# Patient Record
Sex: Male | Born: 1935 | Race: White | Hispanic: No | Marital: Married | Smoking: Never smoker
Health system: Southern US, Community
[De-identification: ages and names within clinical notes are randomized; demographics above are authoritative.]

## PROBLEM LIST (undated history)

## (undated) DIAGNOSIS — E785 Hyperlipidemia, unspecified: Secondary | ICD-10-CM

## (undated) DIAGNOSIS — I10 Essential (primary) hypertension: Secondary | ICD-10-CM

## (undated) DIAGNOSIS — N401 Enlarged prostate with lower urinary tract symptoms: Secondary | ICD-10-CM

## (undated) HISTORY — DX: Benign prostatic hyperplasia with lower urinary tract symptoms: N40.1

## (undated) HISTORY — DX: Hyperlipidemia, unspecified: E78.5

## (undated) HISTORY — DX: Essential (primary) hypertension: I10

---
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
## 2004-03-15 ENCOUNTER — Ambulatory Visit (HOSPITAL_COMMUNITY): Admission: RE | Admit: 2004-03-15 | Discharge: 2004-03-15 | Payer: Self-pay | Admitting: General Surgery

## 2009-02-25 ENCOUNTER — Ambulatory Visit: Payer: Self-pay | Admitting: Family Medicine

## 2009-02-25 DIAGNOSIS — I10 Essential (primary) hypertension: Secondary | ICD-10-CM | POA: Insufficient documentation

## 2009-02-25 DIAGNOSIS — N138 Other obstructive and reflux uropathy: Secondary | ICD-10-CM

## 2009-02-25 DIAGNOSIS — N529 Male erectile dysfunction, unspecified: Secondary | ICD-10-CM | POA: Insufficient documentation

## 2009-02-25 DIAGNOSIS — E785 Hyperlipidemia, unspecified: Secondary | ICD-10-CM | POA: Insufficient documentation

## 2009-02-25 DIAGNOSIS — N401 Enlarged prostate with lower urinary tract symptoms: Secondary | ICD-10-CM

## 2009-02-25 DIAGNOSIS — N400 Enlarged prostate without lower urinary tract symptoms: Secondary | ICD-10-CM | POA: Insufficient documentation

## 2009-02-25 HISTORY — DX: Other obstructive and reflux uropathy: N13.8

## 2009-02-25 HISTORY — DX: Hyperlipidemia, unspecified: E78.5

## 2009-02-25 HISTORY — DX: Benign prostatic hyperplasia with lower urinary tract symptoms: N40.1

## 2009-03-25 ENCOUNTER — Ambulatory Visit: Payer: Self-pay | Admitting: Family Medicine

## 2009-03-26 LAB — CONVERTED CEMR LAB
ALT: 29 units/L (ref 0–53)
BUN: 10 mg/dL (ref 6–23)
Bilirubin, Direct: 0.1 mg/dL (ref 0.0–0.3)
CO2: 29 meq/L (ref 19–32)
Chloride: 100 meq/L (ref 96–112)
GFR calc non Af Amer: 68.38 mL/min (ref >60)
Glucose, Bld: 95 mg/dL (ref 70–99)
HDL: 86.4 mg/dL (ref >39.00)
Potassium: 4.6 meq/L (ref 3.5–5.1)
Total Bilirubin: 1.1 mg/dL (ref 0.3–1.2)
Total CHOL/HDL Ratio: 2

## 2009-05-03 ENCOUNTER — Telehealth: Payer: Self-pay | Admitting: Family Medicine

## 2010-04-07 NOTE — Progress Notes (Signed)
Summary: REQ FOR MED to [facility]  Phone Note Call from Patient   Caller: Patient [phone] Reason for Call: Refill Medication Summary of Call: Pt called to adv that his insurance has changed and he will need a paper prescription for refill on meds:    Simvastatin 20 Mg   and  Lisinopril 10 Mg    and    Avodart 0.5 Mg...Marland KitchenMarland KitchenMarland Kitchen Pt needs a 90-day  (paper/written) RX for same because he will be sending RX to [facility].   Pt can be reached at [phone] with any questions or concerns...Marland KitchenMarland Kitchen Please call when same is ready for p/u.   Initial call taken by: Debbra Riding,  May 03, 2009 11:23 AM  Follow-up for Phone Call        Wife informed we can send by fax, she asked we do that instead of written.  Meds faxed Follow-up by: Sid Falcon LPN,  May 03, 2009 1:21 PM    Prescriptions: AVODART 0.5 MG CAPS (DUTASTERIDE) one by mouth once daily  #90 x 3   Entered by:   Sid Falcon LPN   Authorized by:   Evelena Peat MD   Signed by:   Sid Falcon LPN on 13/10/6576   Method used:   Faxed to ...       [facility]             , [place]         Ph:        Fax: [phone]   RxID:   [id number] LISINOPRIL 10 MG TABS (LISINOPRIL) once daily  #90 x 3   Entered by:   Sid Falcon LPN   Authorized by:   Evelena Peat MD   Signed by:   Sid Falcon LPN on 40/34/7425   Method used:   Faxed to ...       [facility]             , [place]         Ph:        Fax: [phone]   RxID:   [id number] SIMVASTATIN 20 MG TABS (SIMVASTATIN) once daily  #90 x 3   Entered by:   Sid Falcon LPN   Authorized by:   Evelena Peat MD   Signed by:   Sid Falcon LPN on 60/12/9321   Method used:   Faxed to ...       [facility]             , [place]         Ph:        Fax: [phone]   RxID:    [id number]

## 2010-04-07 NOTE — Assessment & Plan Note (Signed)
Summary: PT WILL COME IN FASTING //RS/PT RSC/CJR/PT R/S..SLM   Vital Signs:  Patient profile:   77 year old male Height:      64.25 inches Weight:      132 pounds Temp:     98.7 degrees F oral Pulse rate:   80 / minute Pulse rhythm:   regular Resp:     12 per minute BP sitting:   140 / 70  (left arm) Cuff size:   regular  Vitals Entered By: Sid Falcon LPN (March 25, 2009 8:54 AM) CC: CPX, pt fasting   History of Present Illness: Followup multiple medical problems.  History BPH. Significant nocturia. Recently started Avodart and tolerating well. Thus far no significant improvement in symptoms but only taking for less than one month. No burning with urination.  Hyperlipidemia treated with simvastatin. Needs lab work. Compliant with therapy. No history of CAD. No side effects from medication.  Erectile dysfunction. Has not tried Cialis yet. No history nitroglycerin use.  Hypertension treated with lisinopril. No cough or other side effects. Blood pressures have been well controlled. Denies any headaches, dizziness, chest pain, palpitations.  Patient refuses Pneumovax and flu vaccine. Last tetanus less than 5 years ago.   Allergies (verified): No Known Drug Allergies  Past History:  Past Medical History: Last updated: 02/25/2009 Hyperlipidemia Hypertension BPH  Erectile dysfunction  Past Surgical History: Last updated: 02/25/2009 2 hernia repairs Tonsillectomy  1935  Social History: Last updated: 02/25/2009 Occupation: Married Never Smoked Alcohol use-yes Regular exercise-yes  Review of Systems  The patient denies anorexia, fever, weight loss, weight gain, chest pain, syncope, dyspnea on exertion, peripheral edema, prolonged cough, headaches, hemoptysis, abdominal pain, melena, hematochezia, severe indigestion/heartburn, hematuria, incontinence, muscle weakness, and depression.    Physical Exam  General:  Well-developed,well-nourished,in no acute  distress; alert,appropriate and cooperative throughout examination Head:  Normocephalic and atraumatic without obvious abnormalities. No apparent alopecia or balding. Eyes:  No corneal or conjunctival inflammation noted. EOMI. Perrla. Funduscopic exam benign, without hemorrhages, exudates or papilledema. Vision grossly normal. Ears:  External ear exam shows no significant lesions or deformities.  Otoscopic examination reveals clear canals, tympanic membranes are intact bilaterally without bulging, retraction, inflammation or discharge. Hearing is grossly normal bilaterally. Mouth:  Oral mucosa and oropharynx without lesions or exudates.  Teeth in good repair. Neck:  No deformities, masses, or tenderness noted. Lungs:  Normal respiratory effort, chest expands symmetrically. Lungs are clear to auscultation, no crackles or wheezes. Heart:  Normal rate and regular rhythm. S1 and S2 normal without gallop, murmur, click, rub or other extra sounds. Abdomen:  Bowel sounds positive,abdomen soft and non-tender without masses, organomegaly or hernias noted. Extremities:  No clubbing, cyanosis, edema, or deformity noted with normal full range of motion of all joints.   Neurologic:  alert & oriented X3, cranial nerves II-XII intact, strength normal in all extremities, and gait normal.   Skin:  multiple seborrheic keratoses otherwise no concerning lesions Cervical Nodes:  No lymphadenopathy noted Psych:  Oriented X3, normally interactive, good eye contact, not anxious appearing, and not depressed appearing.     Impression & Recommendations:  Problem # 1:  HYPERTROPHY PROSTATE W/UR OBST & OTH LUTS (ICD-600.01) cont Avodart.  He is aware this will take months to see sig effect.  Problem # 2:  HYPERTENSION (ICD-401.9)  His updated medication list for this problem includes:    Lisinopril 10 Mg Tabs (Lisinopril) ..... Once daily  Orders: TLB-BMP (Basic Metabolic Panel-BMET) (80048-METABOL) Venipuncture  (96295)  Problem #  3:  HYPERLIPIDEMIA (ICD-272.4)  His updated medication list for this problem includes:    Simvastatin 20 Mg Tabs (Simvastatin) ..... Once daily  Orders: TLB-Lipid Panel (80061-LIPID) TLB-Hepatic/Liver Function Pnl (80076-HEPATIC) Venipuncture (21308)  Problem # 4:  IMPOTENCE OF ORGANIC ORIGIN (ICD-607.84) Has not yet tried. His updated medication list for this problem includes:    Cialis 20 Mg Tabs (Tadalafil) ..... One by mouth every other day as needed  Complete Medication List: 1)  Simvastatin 20 Mg Tabs (Simvastatin) .... Once daily 2)  Lisinopril 10 Mg Tabs (Lisinopril) .... Once daily 3)  Aspirin 81 Mg Tabs (Aspirin) .... Once daily 4)  Centrum Ultra Mens Tabs (Multiple vitamins-minerals) .... Once daily 5)  Cialis 20 Mg Tabs (Tadalafil) .... One by mouth every other day as needed 6)  Avodart 0.5 Mg Caps (Dutasteride) .... One by mouth once daily 7)  Calcium-vitamin D 600-200 Mg-unit Tabs (Calcium-vitamin d) .... Once daily  Patient Instructions: 1)  Continue calcium and vitamin D supplementation. 2)  Walk frequently.  Consider podometer and 10,000 steps/day

## 2010-04-07 NOTE — Progress Notes (Signed)
Summary: REQ FOR SCRIPTS (FOR MAIL ORDER)  Phone Note Call from Patient   Caller: Patient [phone] Reason for Call: Talk to Nurse Summary of Call: Pt called back to speak with Harriett Sine, LPN.... Pt adv that his wife misinformed LBF about the prescriptions being able to be faxed into [facility].... This will be the pts first time having prescriptions filled through [facility], Therefore, he has to send in a paper / written prescription.... Please prepare same for meds:  Simvastatin 20 Mg   and  Lisinopril 10 Mg    and    Avodart 0.5 Mg..... Pt can be reached at [phone] when same is ready for p/u.  Initial call taken by: Debbra Riding,  May 03, 2009 2:38 PM  Follow-up for Phone Call        Pt informed 3 RX ready for pick-up Follow-up by: Sid Falcon LPN,  May 03, 2009 3:03 PM    Prescriptions: AVODART 0.5 MG CAPS (DUTASTERIDE) one by mouth once daily  #90 x 3   Entered by:   Sid Falcon LPN   Authorized by:   Evelena Peat MD   Signed by:   Sid Falcon LPN on 37/62/8315   Method used:   Print then Give to Patient   RxID:   [id number] LISINOPRIL 10 MG TABS (LISINOPRIL) once daily  #90 x 3   Entered by:   Sid Falcon LPN   Authorized by:   Evelena Peat MD   Signed by:   Sid Falcon LPN on 85/46/2703   Method used:   Print then Give to Patient   RxID:   [id number] SIMVASTATIN 20 MG TABS (SIMVASTATIN) once daily  #90 x 3   Entered by:   Sid Falcon LPN   Authorized by:   Evelena Peat MD   Signed by:   Sid Falcon LPN on 67/89/3810   Method used:   Print then Give to Patient   RxID:   [id number]

## 2010-04-13 ENCOUNTER — Encounter: Payer: Self-pay | Admitting: Family Medicine

## 2010-04-13 ENCOUNTER — Ambulatory Visit (INDEPENDENT_AMBULATORY_CARE_PROVIDER_SITE_OTHER): Payer: Medicare Other | Admitting: Family Medicine

## 2010-04-13 DIAGNOSIS — N401 Enlarged prostate with lower urinary tract symptoms: Secondary | ICD-10-CM

## 2010-04-13 DIAGNOSIS — I10 Essential (primary) hypertension: Secondary | ICD-10-CM

## 2010-04-13 MED ORDER — LISINOPRIL-HYDROCHLOROTHIAZIDE 10-12.5 MG PO TABS: 1.0000 | ORAL_TABLET | Freq: Every day | ORAL | Status: DC

## 2010-04-13 NOTE — Patient Instructions (Signed)
Hypertension (High Blood Pressure)   As your heart beats, it forces blood through your arteries. This force is your blood pressure. If the pressure is too high, it is called hypertension (HTN) or high blood pressure. HTN is dangerous because you may have it and not know it. High blood pressure may mean that your heart has to work harder to pump blood. Your arteries may be narrow or stiff. The extra work puts you at risk for heart disease, stroke, and other problems.   Blood pressure consists of two numbers, a higher number over a lower, 110/72, for example. It is stated as “110 over 72.” The ideal is below 120 for the top number (systolic) and under 80 for the bottom (diastolic).   You should pay close attention to your blood pressure if you have certain conditions such as:   Ø Heart failure.   Ø Prior heart attack.   Ø Diabetes Ø Chronic kidney disease.   Ø Prior stroke.   Ø Multiple risk factors for heart disease.       To see if you have HTN, your blood pressure should be measured while you are seated with your arm held at the level of the heart. It should be measured at least twice. A one-time elevated blood pressure reading (especially in the Emergency Department) does not mean that you need treatment. There may be conditions in which the blood pressure is different between your right and left arms. It is important to see your caregiver soon for a recheck.   Most people have essential hypertension which means that there is not a specific cause. This type of high blood pressure may be lowered by changing lifestyle factors such as:   Ø Stress.   Ø Smoking.   Ø Lack of exercise. Ø Excessive weight.   Ø Drug/tobacco/alcohol use.   Ø Eating less salt.    Most people do not have symptoms from high blood pressure until it has caused damage to the body. Effective treatment can often prevent, delay or reduce that damage.   TREATMENT:   Treatment for high blood pressure, when a cause has been identified, is directed at  the cause. There are a large number of medications to treat HTN. These fall into several categories, and your caregiver will help you select the medicines that are best for you. Medications may have side effects. You should review side effects with your caregiver.   If your blood pressure stays high after you have made lifestyle changes or started on medicines,   Ø Your medication(s) may need to be changed.   Ø Other problems may need to be addressed.   Ø Be certain you understand your prescriptions, and know how and when to take your medicine.   Ø Be sure to follow up with your caregiver within the time frame advised (usually within two weeks) to have your blood pressure rechecked and to review your medications.   Ø If you are taking more than one medicine to lower your blood pressure, make sure you know how and at what times they should be taken. Taking two medicines at the same time can result in blood pressure that is too low.   SEEK IMMEDIATE MEDICAL CARE IF YOU DEVELOP:   Ø A severe headache, blurred or changing vision, or confusion.   Ø Unusual weakness or numbness, or a faint feeling.   Ø Severe chest or abdominal pain, vomiting, or breathing problems.   It is your responsibility to follow   up with your caregiver in order to determine if your elevated blood pressure should be treated.   MAKE SURE YOU:   Ø Understand these instructions.   Ø Will watch your condition.   Ø Will get help right away if you are not doing well or get worse.   Document Released: 08/03/2005 Document Re-Released: 05/19/2008   ExitCare® Patient Information ©2011 [employer].

## 2010-04-13 NOTE — Progress Notes (Signed)
  Subjective:    Patient ID: Chase Hurst, male    DOB: 1926-10-18, 77 y.o.   MRN: [id number]  HPI  Patient seen for followup hypertension. Recent evaluation by ophthalmologist with blood pressure around 170/100. Takes lisinopril 10 mg daily. Denies any headaches. No dizziness. Compliant with medication. No side effects. Not monitoring blood pressure regularly at home. No history of CAD. No history of renal insufficiency.  History of benign prostate hypertrophy. Occasional nocturia. Takes Avodart 0.5 mg daily. Compliant with medication. Occasional slow stream. No burning with urination. Denies any fever or chills. Previously on Flomax. His symptoms are relatively mild and not interested in further medication this time.   History of hyperlipidemia. Plans to schedule repeat physical this May.   Review of Systems     Patient denies any headaches, fever, chills, appetite or weight change, chest pain, dyspnea, abdominal pain bony changes stool habits. Urinary symptoms as above. Objective:   Physical Exam  patient is alert and in no distress. Oropharynx clear.  Neck no masses  Chest clear to auscultation throughout  Heart regular rhythm and rate Abdomen soft and nontender.   Extremities no edema  Neuro exam alert and oriented times 3. Cranial nerves all intact       Assessment & Plan:   #1   Hypertension suboptimally controlled  #2   Benign prostate hypertrophy    change medication to lisinopril HCTZ 10/12.5 one daily and reassess blood pressure within couple months. Monitor closely at home. Consider addition of Flomax to his Avodart

## 2010-05-09 ENCOUNTER — Telehealth: Payer: Self-pay | Admitting: Family Medicine

## 2010-05-09 NOTE — Telephone Encounter (Signed)
Please advise 

## 2010-05-09 NOTE — Telephone Encounter (Signed)
Pt called and is req refill of Avodart 5 mg to [facility]. Pt also wants to know if this med would have any effect on Catarac surgery. Pt is sch to have cataracs removed on 05/30/10.

## 2010-05-10 MED ORDER — DUTASTERIDE 0.5 MG PO CAPS: 0.5000 mg | ORAL_CAPSULE | Freq: Every day | ORAL | Status: DC

## 2010-05-10 NOTE — Telephone Encounter (Signed)
[facility] as requested, pt informed of Dr Burchette's reply to question

## 2010-05-10 NOTE — Telephone Encounter (Signed)
OK to refill for one year.  No effect on cataract surgery to my knowledge.

## 2010-06-22 ENCOUNTER — Telehealth: Payer: Self-pay | Admitting: Family Medicine

## 2010-06-22 MED ORDER — SIMVASTATIN 20 MG PO TABS: 20.0000 mg | ORAL_TABLET | Freq: Every day | ORAL | Status: DC

## 2010-06-22 NOTE — Telephone Encounter (Signed)
Pt called and is req a 90 day supply of Lisinopril/HCTZ 10-12.5 mg and renewal script for Simvastatin 20 mg tab 90 day supply to [facility].

## 2010-07-19 ENCOUNTER — Ambulatory Visit (INDEPENDENT_AMBULATORY_CARE_PROVIDER_SITE_OTHER): Payer: Medicare Other | Admitting: Family Medicine

## 2010-07-19 ENCOUNTER — Encounter: Payer: Self-pay | Admitting: Family Medicine

## 2010-07-19 VITALS — BP 128/80 | HR 80 | Temp 98.2°F | Resp 12 | Ht 65.0 in | Wt 128.0 lb

## 2010-07-19 DIAGNOSIS — E785 Hyperlipidemia, unspecified: Secondary | ICD-10-CM

## 2010-07-19 DIAGNOSIS — N529 Male erectile dysfunction, unspecified: Secondary | ICD-10-CM

## 2010-07-19 DIAGNOSIS — Z299 Encounter for prophylactic measures, unspecified: Secondary | ICD-10-CM

## 2010-07-19 DIAGNOSIS — I10 Essential (primary) hypertension: Secondary | ICD-10-CM

## 2010-07-19 DIAGNOSIS — N401 Enlarged prostate with lower urinary tract symptoms: Secondary | ICD-10-CM

## 2010-07-19 LAB — HEPATIC FUNCTION PANEL
Albumin: 4 g/dL (ref 3.5–5.2)
Total Protein: 6.7 g/dL (ref 6.0–8.3)

## 2010-07-19 LAB — BASIC METABOLIC PANEL
Chloride: 99 mEq/L (ref 96–112)
Creatinine, Ser: 0.9 mg/dL (ref 0.4–1.5)
Potassium: 3.8 mEq/L (ref 3.5–5.1)
Sodium: 139 mEq/L (ref 135–145)

## 2010-07-19 LAB — LIPID PANEL
Cholesterol: 209 mg/dL — ABNORMAL HIGH (ref 0–200)
HDL: 87.5 mg/dL (ref >39.00)
Triglycerides: 88 mg/dL (ref 0.0–149.0)
VLDL: 17.6 mg/dL (ref 0.0–40.0)

## 2010-07-19 LAB — LDL CHOLESTEROL, DIRECT: Direct LDL: 111.5 mg/dL

## 2010-07-19 MED ORDER — PNEUMOCOCCAL VAC POLYVALENT 25 MCG/0.5ML IJ INJ: 0.5000 mL | INJECTION | Freq: Once | INTRAMUSCULAR | Status: DC

## 2010-07-19 MED ORDER — LISINOPRIL-HYDROCHLOROTHIAZIDE 10-12.5 MG PO TABS: 1.0000 | ORAL_TABLET | Freq: Every day | ORAL | Status: DC

## 2010-07-19 NOTE — Progress Notes (Signed)
Subjective:    Patient ID: Chase Hurst, male    DOB: 09-Feb-1927, 77 y.o.   MRN: [id number]  HPI Patient here for medical followup and Medicare wellness exam. Medical problems include history of hyperlipidemia, erectile dysfunction, hypertension, and BPH. Recent change of medication to lisinopril HCTZ. Blood pressure stable but home readings. No orthostatic symptoms. No cough. Denies chest pain.  Remains on simvastatin for hyperlipidemia. No myalgias. No history of CAD or peripheral vascular disease. Remains quite active. 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  Still has some nocturia but no obstructive symptoms. Remains on Avodart for BPH. Also takes Cialis for erectile dysfunction.  No hx of CAD.  Past Medical History  Diagnosis Date  . HYPERLIPIDEMIA 02/25/2009  . HYPERTROPHY PROSTATE W/UR OBST \\T \ OTH LUTS 02/25/2009   Past Surgical History  Procedure Date  . Hernia repair 1935    reports that he has never smoked. He does not have any smokeless tobacco history on file. His alcohol and drug histories not on file. family history is not on file. No Known Allergies   1.  Risk factors based on Past Medical , Social, and Family history  as above, history of hyperlipidemia, hypertension, erectile dysfunction, and BPH. Nonsmoker. No alcohol use. Married and wife has several medical problems 2.  Limitations in physical activities stays very active. No recent falls 3.  Depression/mood no depression or anxiety issues 4.  Hearing no hearing deficits 5.  ADLs fully independent in all 6.  Cognitive function (orientation to time and place, language, writing, speech,memory) no short or long-term memory deficits. Judgment intact. No language deficits 7.  Home Safety no issues identified 8.  Height, weight, and visual acuity. Recent cataract surgery. No visual impairment following surgery. Seen by ophthalmologist just 2 weeks ago. Height and weight are unchanged. 9.  Counseling  Discussed  advanced directives, exercise, yearly flu vaccine and need for vaccines below. 10. Recommendation of preventive services. Tetanus up-to-date. Needs Pneumovax. Check on shingles vaccine coverage 11. Labs based on risk factors lipid panel, hepatic panel, and basic metabolic panel 12. Care Plan  As above.  Recommendations discussed with patient.    Review of Systems  Constitutional: Negative for fever, activity change, appetite change and fatigue.  HENT: Negative for ear pain, congestion and trouble swallowing.   Eyes: Negative for pain and visual disturbance.  Respiratory: Negative for cough, shortness of breath and wheezing.   Cardiovascular: Negative for chest pain and palpitations.  Gastrointestinal: Negative for nausea, vomiting, abdominal pain, diarrhea, constipation, blood in stool, abdominal distention and rectal pain.  Genitourinary: Negative for dysuria, hematuria and testicular pain.  Musculoskeletal: Negative for joint swelling and arthralgias.  Skin: Negative for rash.  Neurological: Negative for dizziness, syncope and headaches.  Hematological: Negative for adenopathy.  Psychiatric/Behavioral: Negative for confusion and dysphoric mood.       Objective:   Physical Exam  Constitutional: He is oriented to person, place, and time. He appears well-developed and well-nourished. No distress.  HENT:  Head: Normocephalic and atraumatic.  Right Ear: External ear normal.  Left Ear: External ear normal.  Mouth/Throat: Oropharynx is clear and moist.  Eyes: Conjunctivae and EOM are normal. Pupils are equal, round, and reactive to light.  Neck: Normal range of motion. Neck supple. No thyromegaly present.  Cardiovascular: Normal rate, regular rhythm and normal heart sounds.   No murmur heard. Pulmonary/Chest: No respiratory distress. He has no wheezes. He has no rales.  Abdominal: Soft. Bowel sounds are normal. He exhibits  no distension and no mass. There is no tenderness. There is no  rebound and no guarding.  Musculoskeletal: He exhibits no edema.  Lymphadenopathy:    He has no cervical adenopathy.  Neurological: He is alert and oriented to person, place, and time. He displays normal reflexes. No cranial nerve deficit.  Skin: No rash noted.  Psychiatric: He has a normal mood and affect.          Assessment & Plan:  #1  Medicare Wellness Exam.  PVX given.  He will check on coverage for Shingles vaccine. #2  Hypertension-improved.  Continue with current medication. #3  Hyperlipidemia.  Lipids ordered. #4  Erectile dysfunction. #5  History of BPH.  Continue with Avodart.

## 2010-07-20 NOTE — Progress Notes (Signed)
Quick Note:  Pt informed ______ 

## 2010-07-22 NOTE — Op Note (Signed)
NAME:  Chase Hurst, Chase Hurst NO.:  [id number]   MEDICAL RECORD NO.:  [id number]          PATIENT TYPE:  AMB   LOCATION:  DAY                          FACILITY:  [facility]   PHYSICIAN:  Gita Kudo, M.D. DATE OF BIRTH:  1926-07-05   DATE OF PROCEDURE:  03/15/2004  DATE OF DISCHARGE:                                 OPERATIVE REPORT   OPERATIVE PROCEDURE:  Left inguinal hernia repair - combined direct and  indirect, Kugel-Bard two piece mesh.   SURGEON:  Gita Kudo, M.D.   ANESTHESIA:  General.   PREOPERATIVE DIAGNOSIS:  Left inguinal hernia.   POSTOPERATIVE DIAGNOSIS:  Left inguinal hernia, combined direct and  indirect.   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  The hernia has been present for many years and  it has become larger, symptomatic, and he would like it repaired.  He has  had a previous right hernia repair in the past.   OPERATIVE FINDINGS:  The patient had a large indirect hernia sac that  appeared to be a sliding type hernia.  There was a medium size direct  hernia, also.  It was felt best to just dissect the sac high and invert it  rather than try to open and remove it.   OPERATIVE PROCEDURE:  Under satisfactory general anesthesia, having received  1 gram Ancef preop, the patient was positioned, prepped and draped in a  standard fashion.  A total of 30 mL of 0.5% Marcaine with epinephrine was  infiltrated during the procedure for postop analgesia.  A transverse left  lower abdominal incision was made and carried down to and through the  external ring and external oblique.  The bleeders were coagulated or tied  with 3-0 Vicryl.  The cord and its contents were mobilized with a Penrose  drain.  A self-retaining retractor was placed for excellent exposure.  The  indirect sac was identified, dissected high to the internal ring, and  reduced.  The floor of the canal was then opened from  the pubis to the  internal ring and finger dissection used to develop the preperitoneal space  for mesh placement.  The contents were held away with a moistened gauze.  The circular portion of the mesh was anchored at Coopers ligament with a 0  Prolene suture and then unfolded laterally and inferiorly.  The moistened  gauze was removed and the mesh unfolded superiorly and medially.  It  extended from the pubis to the internal ring and lay under the epigastric  vessels.  A 0 Prolene suture was used to approximate the medial portion of  the mesh to the under surface of the abdominal wall.  Then the floor of the  canal was closed over the mesh with a running 0 Prolene taking intermittent  bites of the mesh and at the ring when tied, the ends of the suture left  long, and the ring was snug.  Then, the oval portion of the mesh was  tailored to fit in the floor of the canal and a slit made  to go around the  cord structures.  It was anchored at the ring with the previous suture and  tacked around the periphery with three 0 Prolene sutures to the internal  oblique, soft tissue near the pubis, and inguinal ligament.  The tails were  then brought around the cord and sutured to each other.  The wound was  lavaged with saline, checked for  hemostasis, and then closed in layers with running 2-0 Vicryl for external  oblique, 2-0 Vicryl for the deep fascia, 3-0 Vicryl subcu.  Steri-Strips  approximate the skin and sterile absorbent dressings were applied.  It is  anticipated that he will be discharged home today and followed as an  outpatient.      MRL/MEDQ  D:  03/15/2004  T:  03/15/2004  Job:  [id number]   cc:   Teena Irani. Arlyce Dice, M.D.  [address]  [place]  [place] [address]  Fax: [phone]

## 2010-08-08 ENCOUNTER — Ambulatory Visit (INDEPENDENT_AMBULATORY_CARE_PROVIDER_SITE_OTHER): Payer: Medicare Other | Admitting: Family Medicine

## 2010-08-08 DIAGNOSIS — Z299 Encounter for prophylactic measures, unspecified: Secondary | ICD-10-CM

## 2010-08-08 DIAGNOSIS — Z2911 Encounter for prophylactic immunotherapy for respiratory syncytial virus (RSV): Secondary | ICD-10-CM

## 2010-09-14 ENCOUNTER — Telehealth: Payer: Self-pay | Admitting: *Deleted

## 2010-09-15 NOTE — Telephone Encounter (Signed)
Opened in error

## 2010-10-28 ENCOUNTER — Telehealth: Payer: Self-pay | Admitting: Family Medicine

## 2010-10-28 MED ORDER — DUTASTERIDE 0.5 MG PO CAPS: 0.5000 mg | ORAL_CAPSULE | Freq: Every day | ORAL | Status: DC

## 2010-10-28 NOTE — Telephone Encounter (Signed)
Mail order pharmacy only sent the patient #30 of his Avodart instead of #90. Please call in the remainder of his #60 to [facility]. He will pay out of pocket. Thanks.

## 2011-04-05 ENCOUNTER — Other Ambulatory Visit: Payer: Self-pay | Admitting: *Deleted

## 2011-04-05 DIAGNOSIS — I10 Essential (primary) hypertension: Secondary | ICD-10-CM

## 2011-04-05 MED ORDER — LISINOPRIL-HYDROCHLOROTHIAZIDE 10-12.5 MG PO TABS: 1.0000 | ORAL_TABLET | Freq: Every day | ORAL | Status: DC

## 2011-06-06 ENCOUNTER — Telehealth: Payer: Self-pay | Admitting: Family Medicine

## 2011-06-06 MED ORDER — DUTASTERIDE 0.5 MG PO CAPS: 0.5000 mg | ORAL_CAPSULE | Freq: Every day | ORAL | Status: DC

## 2011-06-06 NOTE — Telephone Encounter (Signed)
Wife informed

## 2011-06-06 NOTE — Telephone Encounter (Signed)
Pt is coming in may for a cox butt will be out of dutasteride (AVODART) 0.5 MG capsule before then and is requesting a refill to get him to time of appt   Pharmacy: [facility]

## 2011-07-04 ENCOUNTER — Telehealth: Payer: Self-pay | Admitting: Family Medicine

## 2011-07-04 DIAGNOSIS — I10 Essential (primary) hypertension: Secondary | ICD-10-CM

## 2011-07-04 NOTE — Telephone Encounter (Signed)
Pt is scheduled for a cpx 07/21/11 but will be out of medication before then, Pt requesting a 90 day refill on  dutasteride (AVODART) 0.5 MG capsule simvastatin (ZOCOR) 20 MG tablet lisinopril-hydrochlorothiazide (PRINZIDE,ZESTORETIC) 10-12.5 MG per tablet    [facility] - [place], [place] - [address]

## 2011-07-05 MED ORDER — SIMVASTATIN 20 MG PO TABS: 20.0000 mg | ORAL_TABLET | Freq: Every day | ORAL | Status: DC

## 2011-07-05 MED ORDER — DUTASTERIDE 0.5 MG PO CAPS: 0.5000 mg | ORAL_CAPSULE | Freq: Every day | ORAL | Status: DC

## 2011-07-05 MED ORDER — LISINOPRIL-HYDROCHLOROTHIAZIDE 10-12.5 MG PO TABS: 1.0000 | ORAL_TABLET | Freq: Every day | ORAL | Status: DC

## 2011-07-21 ENCOUNTER — Encounter: Payer: Self-pay | Admitting: Family Medicine

## 2011-07-21 ENCOUNTER — Ambulatory Visit (INDEPENDENT_AMBULATORY_CARE_PROVIDER_SITE_OTHER): Payer: Medicare Other | Admitting: Family Medicine

## 2011-07-21 VITALS — BP 128/70 | HR 72 | Temp 97.7°F | Resp 12 | Ht 65.0 in | Wt 128.0 lb

## 2011-07-21 DIAGNOSIS — Z0000 Encounter for general adult medical examination without abnormal findings: Secondary | ICD-10-CM

## 2011-07-21 DIAGNOSIS — N401 Enlarged prostate with lower urinary tract symptoms: Secondary | ICD-10-CM

## 2011-07-21 DIAGNOSIS — E785 Hyperlipidemia, unspecified: Secondary | ICD-10-CM

## 2011-07-21 DIAGNOSIS — I10 Essential (primary) hypertension: Secondary | ICD-10-CM

## 2011-07-21 LAB — LIPID PANEL
HDL: 88.2 mg/dL (ref >39.00)
Triglycerides: 63 mg/dL (ref 0.0–149.0)

## 2011-07-21 LAB — BASIC METABOLIC PANEL
Calcium: 9.6 mg/dL (ref 8.4–10.5)
GFR: 78.6 mL/min (ref >60.00)
Glucose, Bld: 86 mg/dL (ref 70–99)
Sodium: 139 mEq/L (ref 135–145)

## 2011-07-21 LAB — HEPATIC FUNCTION PANEL
ALT: 23 U/L (ref 0–53)
AST: 28 U/L (ref 0–37)
Bilirubin, Direct: 0.2 mg/dL (ref 0.0–0.3)
Total Protein: 6.8 g/dL (ref 6.0–8.3)

## 2011-07-21 NOTE — Progress Notes (Signed)
Subjective:    Patient ID: Chase Hurst, male    DOB: 09-20-1926, 78 y.o.   MRN: [id number]  HPI  Patient here for medical followup and Medicare wellness exam. Past medical history significant for hyperlipidemia, BPH, erectile dysfunction, and hypertension. Medications reviewed. Compliant with all. No medication side effects.   He takes Avodart regularly but still has some nocturia. No daytime obstructive symptoms. He thinks he has taken Flomax previously. Patient refuses colonoscopy. Immunizations are up-to-date.  No history of smoking. 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  Past Medical History  Diagnosis Date  . HYPERLIPIDEMIA 02/25/2009  . HYPERTROPHY PROSTATE W/UR OBST \\T \ OTH LUTS 02/25/2009   Past Surgical History  Procedure Date  . Hernia repair 1935    reports that he has never smoked. He does not have any smokeless tobacco history on file. His alcohol and drug histories not on file. family history is not on file. No Known Allergies  1.  Risk factors based on Past Medical , Social, and Family history reviewed as above. 2.  Limitations in physical activities very active. Low risk for fall 3.  Depression/mood no depression or anxiety issues. 4.  Hearing no major deficits. 5.  ADLs fully independent in all. 6.  Cognitive function (orientation to time and place, language, writing, speech,memory) memory intact. Language and judgment intact 7.  Home Safety no issues 8.  Height, weight, and visual acuity. Recent cataract surgery within the past year. Visual acuity stable. No hearing changes. Weight stable 9.  Counseling counseled regarding colonoscopy screening but he refuses. 10. Recommendation of preventive services. Immunizations up-to-date. Colonoscopy discussion as above 11. Labs based on risk factors lipid panel, hepatic panel, and basic metabolic panel 12. Care Plan as above.     Review of Systems  Constitutional: Negative for fever, activity  change, appetite change and fatigue.  HENT: Negative for ear pain, congestion and trouble swallowing.   Eyes: Negative for pain and visual disturbance.  Respiratory: Negative for cough, shortness of breath and wheezing.   Cardiovascular: Negative for chest pain and palpitations.  Gastrointestinal: Negative for nausea, vomiting, abdominal pain, diarrhea, constipation, blood in stool, abdominal distention and rectal pain.  Genitourinary: Positive for difficulty urinating (Only mild symptoms at night). Negative for hematuria and testicular pain.  Musculoskeletal: Negative for joint swelling and arthralgias.  Skin: Negative for rash.  Neurological: Negative for dizziness, syncope and headaches.  Hematological: Negative for adenopathy.  Psychiatric/Behavioral: Negative for confusion and dysphoric mood.       Objective:   Physical Exam  Constitutional: He is oriented to person, place, and time. He appears well-developed and well-nourished. No distress.  HENT:  Head: Normocephalic and atraumatic.  Right Ear: External ear normal.  Left Ear: External ear normal.  Mouth/Throat: Oropharynx is clear and moist.  Eyes: Conjunctivae and EOM are normal. Pupils are equal, round, and reactive to light.  Neck: Normal range of motion. Neck supple. No thyromegaly present.  Cardiovascular: Normal rate, regular rhythm and normal heart sounds.   No murmur heard. Pulmonary/Chest: No respiratory distress. He has no wheezes. He has no rales.  Abdominal: Soft. Bowel sounds are normal. He exhibits no distension and no mass. There is no tenderness. There is no rebound and no guarding.  Musculoskeletal: He exhibits no edema.  Lymphadenopathy:    He has no cervical adenopathy.  Neurological: He is alert and oriented to person, place, and time. He displays normal reflexes. No cranial nerve deficit.  Skin: No rash noted.  Multiple seborrheic keratoses. No concerning lesions.  Psychiatric: He has a normal mood  and affect.          Assessment & Plan:  #1 health maintenance. Discussed colonoscopy screening and he is not interested. He also refuses Hemoccult cards. Immunizations up-to-date. #2 BPH. Still has some nighttime symptoms. Offered addition of Flomax and he wishes to observe this time. #3 hypertension stable by home readings. Check basic metabolic panel #4 hyperlipidemia. Check lipid and hepatic panel. Continue simvastatin.

## 2011-07-26 NOTE — Progress Notes (Signed)
Quick Note:  Wife informed ______ 

## 2011-09-19 ENCOUNTER — Other Ambulatory Visit: Payer: Self-pay | Admitting: *Deleted

## 2011-09-19 DIAGNOSIS — I10 Essential (primary) hypertension: Secondary | ICD-10-CM

## 2011-09-19 MED ORDER — DUTASTERIDE 0.5 MG PO CAPS: 0.5000 mg | ORAL_CAPSULE | Freq: Every day | ORAL | Status: DC

## 2011-09-19 MED ORDER — SIMVASTATIN 20 MG PO TABS: 20.0000 mg | ORAL_TABLET | Freq: Every day | ORAL | Status: DC

## 2011-09-19 MED ORDER — LISINOPRIL-HYDROCHLOROTHIAZIDE 10-12.5 MG PO TABS: 1.0000 | ORAL_TABLET | Freq: Every day | ORAL | Status: DC

## 2012-07-01 ENCOUNTER — Other Ambulatory Visit: Payer: Self-pay | Admitting: Family Medicine

## 2012-07-23 ENCOUNTER — Ambulatory Visit (INDEPENDENT_AMBULATORY_CARE_PROVIDER_SITE_OTHER): Payer: Medicare Other | Admitting: Family Medicine

## 2012-07-23 ENCOUNTER — Encounter: Payer: Self-pay | Admitting: Family Medicine

## 2012-07-23 VITALS — HR 80 | Temp 98.0°F | Resp 12 | Ht 65.0 in | Wt 127.0 lb

## 2012-07-23 DIAGNOSIS — E785 Hyperlipidemia, unspecified: Secondary | ICD-10-CM

## 2012-07-23 DIAGNOSIS — Z0000 Encounter for general adult medical examination without abnormal findings: Secondary | ICD-10-CM

## 2012-07-23 DIAGNOSIS — I10 Essential (primary) hypertension: Secondary | ICD-10-CM

## 2012-07-23 DIAGNOSIS — L989 Disorder of the skin and subcutaneous tissue, unspecified: Secondary | ICD-10-CM

## 2012-07-23 LAB — BASIC METABOLIC PANEL
Calcium: 9.9 mg/dL (ref 8.4–10.5)
GFR: 73.16 mL/min (ref >60.00)
Sodium: 141 mEq/L (ref 135–145)

## 2012-07-23 LAB — HEPATIC FUNCTION PANEL
ALT: 24 U/L (ref 0–53)
Albumin: 4.2 g/dL (ref 3.5–5.2)
Total Protein: 7.2 g/dL (ref 6.0–8.3)

## 2012-07-23 LAB — LDL CHOLESTEROL, DIRECT: Direct LDL: 93.2 mg/dL

## 2012-07-23 LAB — LIPID PANEL
HDL: 92.6 mg/dL (ref >39.00)
Triglycerides: 134 mg/dL (ref 0.0–149.0)

## 2012-07-23 MED ORDER — SIMVASTATIN 20 MG PO TABS: ORAL_TABLET | ORAL | Status: DC

## 2012-07-23 MED ORDER — DUTASTERIDE 0.5 MG PO CAPS: ORAL_CAPSULE | ORAL | Status: DC

## 2012-07-23 MED ORDER — LISINOPRIL-HYDROCHLOROTHIAZIDE 10-12.5 MG PO TABS: 1.0000 | ORAL_TABLET | Freq: Every day | ORAL | Status: DC

## 2012-07-23 NOTE — Patient Instructions (Addendum)
We will call you regarding ENT referral. 

## 2012-07-23 NOTE — Progress Notes (Signed)
Subjective:    Patient ID: Chase Hurst, male    DOB: 1926/03/26, 79 y.o.   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 Medications reviewed and compliant with all. Still has occasional nocturia but no significant obstructive symptoms. Immunizations reviewed and up-to-date with all He is refused colonoscopies.  Has small nodular crusted lesion just below left lower lip which he states has been present for 2 years. We've not noted this previously. He states this is frequently agitated with shaving.  Past Medical History  Diagnosis Date  . HYPERLIPIDEMIA 02/25/2009  . HYPERTROPHY PROSTATE W/UR OBST \\T \ OTH LUTS 02/25/2009  . Hypertension    Past Surgical History  Procedure Laterality Date  . Hernia repair  1935    reports that he has never smoked. He does not have any smokeless tobacco history on file. His alcohol and drug histories are not on file. family history is not on file. No Known Allergies  1.  Risk factors based on Past Medical , Social, and Family history reviewed and as above 2.  Limitations in physical activities dear reactive. Low risk for fall 3.  Depression/mood no depression or anxiety issues 4.  Hearing no major deficits 5.  ADLs independent in all 6.  Cognitive function (orientation to time and place, language, writing, speech,memory) no memory deficits. Language and judgment intact 7.  Home Safety no issues 8.  Height, weight, and visual acuity. Height and weight stable. No visual changes 9.  Counseling discussed diet and exercise 10. Recommendation of preventive services. Discussed colon cancer screening and he agrees to Hemoccult cards but refuses colonoscopy. Immunizations up  to date. Continue yearly flu vaccine 11. Labs based on risk factors lipid, hepatic, basic metabolic panel 12. Care Plan as above    Review of Systems  Constitutional: Negative for fever, activity change, appetite change and fatigue.  HENT: Negative for ear pain, congestion and trouble swallowing.   Eyes: Negative for pain and visual disturbance.  Respiratory: Negative for cough, shortness of breath and wheezing.   Cardiovascular: Negative for chest pain and palpitations.  Gastrointestinal: Negative for nausea, vomiting, abdominal pain, diarrhea, constipation, blood in stool, abdominal distention and rectal pain.  Genitourinary: Positive for frequency. Negative for urgency, hematuria and testicular pain.  Musculoskeletal: Negative for joint swelling and arthralgias.  Skin: Negative for rash.  Neurological: Negative for dizziness, syncope and headaches.  Hematological: Negative for adenopathy. Does not bruise/bleed easily.  Psychiatric/Behavioral: Positive for sleep disturbance. Negative for confusion and dysphoric mood.       Objective:   Physical Exam  Constitutional: He is oriented to person, place, and time. He appears well-developed and well-nourished. No distress.  HENT:  Head: Normocephalic and atraumatic.  Right Ear: External ear normal.  Left Ear: External ear normal.  Mouth/Throat: Oropharynx is clear and moist.  Eyes: Conjunctivae and EOM are normal. Pupils are equal, round, and reactive to light.  Neck: Normal range of motion. Neck supple. No thyromegaly present.  Cardiovascular: Normal rate, regular rhythm and normal heart sounds.   No murmur heard. Pulmonary/Chest: No respiratory distress. He has no wheezes. He has no rales.  Abdominal: Soft. Bowel sounds are normal. He exhibits no distension and no mass. There is no tenderness. There is no rebound  and no guarding.  Musculoskeletal: He exhibits no edema.  Lymphadenopathy:    He has no cervical adenopathy.   Neurological: He is alert and oriented to person, place, and time. He displays normal reflexes. No cranial nerve deficit.  Skin: No rash noted.  Left face just below the left lower lip small crusted area about 4 mm diameter. Slightly indurated.  Psychiatric: He has a normal mood and affect.          Assessment & Plan:  #1 health maintenance. Discussed colon screening. He agrees to Hemoccult cards as above. Immunizations up to date #2 hypertension. Well controlled at home readings. Refill medications for one year. Check basic metabolic panel #3 hyperlipidemia. Refill simvastatin. Check lipid and hepatic panel #4 small nodule just below left lower lip. Given duration, ear nose throat referral for further  evaluation #5 BPH. Symptomatically stable. Continue Avodart

## 2012-07-31 NOTE — Progress Notes (Signed)
Having a local procedure-instructions to have coffee and take htn med-bring ins card picture id and meds-

## 2012-08-05 NOTE — H&P (Signed)
PREOPERATIVE H&P  Chief Complaint: chronic sore left lower lip  HPI: Chase Hurst is a 79 y.o. male who presents for evaluation of a lower lip sore he's had for over a year.It gives him trouble when he shaves and sometimes bleeds. On exam he has 1cm left lower lip lesion questionable skin ca. He's taken to the OR for local excision.  Past Medical History  Diagnosis Date  . HYPERLIPIDEMIA 02/25/2009  . HYPERTROPHY PROSTATE W/UR OBST \\T \ OTH LUTS 02/25/2009  . Hypertension    Past Surgical History  Procedure Laterality Date  . Hernia repair  1935   History   Social History  . Marital Status: Married    Spouse Name: N/A    Number of Children: N/A  . Years of Education: N/A   Social History Main Topics  . Smoking status: Never Smoker   . Smokeless tobacco: Not on file  . Alcohol Use: Not on file  . Drug Use: Not on file  . Sexually Active: Not on file   Other Topics Concern  . Not on file   Social History Narrative  . No narrative on file   No family history on file. No Known Allergies Prior to Admission medications   Medication Sig Start Date End Date Taking? Authorizing Provider  aspirin 81 MG tablet Take 81 mg by mouth daily.      Historical Provider, MD  Calcium Carbonate-Vitamin D 600-200 MG-UNIT CAPS Take by mouth daily.      Historical Provider, MD  dutasteride (AVODART) 0.5 MG capsule Take 1 capsule by mouth  daily 07/23/12   Kristian Covey, MD  lisinopril-hydrochlorothiazide (PRINZIDE,ZESTORETIC) 10-12.5 MG per tablet Take 1 tablet by mouth daily. 07/23/12 07/23/13  Kristian Covey, MD  Multiple Vitamins-Minerals (CENTRUM ULTRA MENS) TABS Take by mouth daily.      Historical Provider, MD  simvastatin (ZOCOR) 20 MG tablet Take 1 tablet by mouth at  bedtime 07/23/12   Kristian Covey, MD  tadalafil (CIALIS) 20 MG tablet Take 20 mg by mouth daily as needed.      Historical Provider, MD     Positive ROS: negative  All other systems have been reviewed and  were otherwise negative with the exception of those mentioned in the HPI and as above.  Physical Exam: There were no vitals filed for this visit.  General: Alert, no acute distress Oral: Normal oral mucosa and tonsils. 8 mm left lower lip ulcer. Also small papilloma of right upper eyelid Nasal: Clear nasal passages Neck: No palpable adenopathy or thyroid nodules Ear: Ear canal is clear with normal appearing TMs Cardiovascular: Regular rate and rhythm, no murmur.  Respiratory: Clear to auscultation Neurologic: Alert and oriented x 3   Assessment/Plan: LIP LESION AND RIGHT EYE LESION Plan for Procedure(s): EXCISION LIP AND EYE LESION   Dillard Cannon, MD 08/05/2012 4:13 PM

## 2012-08-06 ENCOUNTER — Encounter (HOSPITAL_BASED_OUTPATIENT_CLINIC_OR_DEPARTMENT_OTHER): Payer: Self-pay | Admitting: *Deleted

## 2012-08-06 ENCOUNTER — Encounter (HOSPITAL_BASED_OUTPATIENT_CLINIC_OR_DEPARTMENT_OTHER)
Admission: RE | Disposition: A | Discharge status: Home / Self Care | Payer: Self-pay | Source: Ambulatory Visit | Attending: Otolaryngology

## 2012-08-06 ENCOUNTER — Ambulatory Visit (HOSPITAL_BASED_OUTPATIENT_CLINIC_OR_DEPARTMENT_OTHER)
Admission: RE | Admit: 2012-08-06 | Discharge: 2012-08-06 | Disposition: A | Discharge status: Home / Self Care | Payer: Medicare Other | Source: Ambulatory Visit | Attending: Otolaryngology | Admitting: Otolaryngology

## 2012-08-06 DIAGNOSIS — D2310 Other benign neoplasm of skin of unspecified eyelid, including canthus: Secondary | ICD-10-CM | POA: Insufficient documentation

## 2012-08-06 DIAGNOSIS — E785 Hyperlipidemia, unspecified: Secondary | ICD-10-CM | POA: Insufficient documentation

## 2012-08-06 DIAGNOSIS — N400 Enlarged prostate without lower urinary tract symptoms: Secondary | ICD-10-CM | POA: Insufficient documentation

## 2012-08-06 DIAGNOSIS — I10 Essential (primary) hypertension: Secondary | ICD-10-CM | POA: Insufficient documentation

## 2012-08-06 DIAGNOSIS — C4401 Basal cell carcinoma of skin of lip: Secondary | ICD-10-CM | POA: Insufficient documentation

## 2012-08-06 HISTORY — PX: LESION REMOVAL: SHX5196

## 2012-08-06 SURGERY — MINOR EXCISION OF LESION
Anesthesia: LOCAL | Site: Face | Laterality: Bilateral | Wound class: Clean

## 2012-08-06 MED ORDER — LIDOCAINE-EPINEPHRINE 1 %-1:100000 IJ SOLN
INTRAMUSCULAR | Status: DC | PRN
  Administered 2012-08-06: 3 mL

## 2012-08-06 MED ORDER — BACITRACIN ZINC 500 UNIT/GM EX OINT
TOPICAL_OINTMENT | CUTANEOUS | Status: DC | PRN
  Administered 2012-08-06: 1 via TOPICAL

## 2012-08-06 SURGICAL SUPPLY — 58 items
ADH SKN CLS APL DERMABOND .7 (GAUZE/BANDAGES/DRESSINGS)
APL SKNCLS STERI-STRIP NONHPOA (GAUZE/BANDAGES/DRESSINGS)
BALL CTTN LRG ABS STRL LF (GAUZE/BANDAGES/DRESSINGS)
BANDAGE ADHESIVE 1X3 (GAUZE/BANDAGES/DRESSINGS) IMPLANT
BENZOIN TINCTURE PRP APPL 2/3 (GAUZE/BANDAGES/DRESSINGS) IMPLANT
BLADE SURG 15 STRL LF DISP TIS (BLADE) ×1 IMPLANT
BLADE SURG 15 STRL SS (BLADE) ×2
CANISTER SUCTION 1200CC (MISCELLANEOUS) IMPLANT
CAUTERY EYE LOW TEMP 1300F FIN (OPHTHALMIC RELATED) ×1 IMPLANT
CLEANER CAUTERY TIP 5X5 PAD (MISCELLANEOUS) IMPLANT
CLOTH BEACON ORANGE TIMEOUT ST (SAFETY) ×2 IMPLANT
COTTONBALL LRG STERILE PKG (GAUZE/BANDAGES/DRESSINGS) IMPLANT
DECANTER SPIKE VIAL GLASS SM (MISCELLANEOUS) IMPLANT
DERMABOND ADVANCED (GAUZE/BANDAGES/DRESSINGS)
DERMABOND ADVANCED .7 DNX12 (GAUZE/BANDAGES/DRESSINGS) IMPLANT
DRSG TEGADERM 4X4.75 (GAUZE/BANDAGES/DRESSINGS) IMPLANT
ELECT COATED BLADE 2.86 ST (ELECTRODE) ×1 IMPLANT
ELECT REM PT RETURN 9FT ADLT (ELECTROSURGICAL)
ELECTRODE REM PT RTRN 9FT ADLT (ELECTROSURGICAL) ×1 IMPLANT
GAUZE SPONGE 4X4 12PLY STRL LF (GAUZE/BANDAGES/DRESSINGS) IMPLANT
GAUZE SPONGE 4X4 16PLY XRAY LF (GAUZE/BANDAGES/DRESSINGS) IMPLANT
GLOVE BIO SURGEON STRL SZ 6.5 (GLOVE) ×1 IMPLANT
GLOVE BIOGEL PI IND STRL 6.5 (GLOVE) IMPLANT
GLOVE BIOGEL PI INDICATOR 6.5 (GLOVE) ×1
GLOVE ECLIPSE 6.5 STRL STRAW (GLOVE) ×1 IMPLANT
GLOVE SS BIOGEL STRL SZ 7.5 (GLOVE) ×1 IMPLANT
GLOVE SUPERSENSE BIOGEL SZ 7.5 (GLOVE) ×1
GOWN PREVENTION PLUS XLARGE (GOWN DISPOSABLE) IMPLANT
MARKER SKIN DUAL TIP RULER LAB (MISCELLANEOUS) IMPLANT
NEEDLE 27GAX1X1/2 (NEEDLE) ×2 IMPLANT
NS IRRIG 1000ML POUR BTL (IV SOLUTION) IMPLANT
PACK BASIN DAY SURGERY FS (CUSTOM PROCEDURE TRAY) ×1 IMPLANT
PAD CLEANER CAUTERY TIP 5X5 (MISCELLANEOUS)
PENCIL BUTTON HOLSTER BLD 10FT (ELECTRODE) ×1 IMPLANT
SPONGE INTESTINAL PEANUT (DISPOSABLE) IMPLANT
STRIP CLOSURE SKIN 1/2X4 (GAUZE/BANDAGES/DRESSINGS) IMPLANT
STRIP CLOSURE SKIN 1/4X4 (GAUZE/BANDAGES/DRESSINGS) IMPLANT
SUCTION FRAZIER TIP 10 FR DISP (SUCTIONS) IMPLANT
SUT CHROMIC 3 0 PS 2 (SUTURE) IMPLANT
SUT CHROMIC 4 0 P 3 18 (SUTURE) IMPLANT
SUT ETHILON 5 0 P 3 18 (SUTURE)
SUT ETHILON 6 0 P 1 (SUTURE) ×1 IMPLANT
SUT NYLON ETHILON 5-0 P-3 1X18 (SUTURE) IMPLANT
SUT PLAIN 5 0 P 3 18 (SUTURE) IMPLANT
SUT SILK 4 0 TIES 17X18 (SUTURE) IMPLANT
SUT VIC AB 4-0 P-3 18XBRD (SUTURE) IMPLANT
SUT VIC AB 4-0 P3 18 (SUTURE)
SUT VIC AB 5-0 P-3 18X BRD (SUTURE) IMPLANT
SUT VIC AB 5-0 P3 18 (SUTURE) ×2
SWAB COLLECTION DEVICE MRSA (MISCELLANEOUS) IMPLANT
SWABSTICK POVIDONE IODINE SNGL (MISCELLANEOUS) ×5 IMPLANT
SYR BULB 3OZ (MISCELLANEOUS) IMPLANT
SYR CONTROL 10ML LL (SYRINGE) ×2 IMPLANT
TOWEL OR 17X24 6PK STRL BLUE (TOWEL DISPOSABLE) ×2 IMPLANT
TRAY DSU PREP LF (CUSTOM PROCEDURE TRAY) IMPLANT
TUBE ANAEROBIC SPECIMEN COL (MISCELLANEOUS) IMPLANT
TUBE CONNECTING 20X1/4 (TUBING) IMPLANT
YANKAUER SUCT BULB TIP NO VENT (SUCTIONS) IMPLANT

## 2012-08-06 NOTE — OR Nursing (Signed)
Patient voices understanding of post-op instructions and denies dizziness or pain.  Contacted son Homero Fellers) for transportation home.   Discharged ambulatory to lobby with steady gait.

## 2012-08-06 NOTE — Interval H&P Note (Signed)
History and Physical Interval Note:  08/06/2012 7:31 AM  Chase Hurst  has presented today for surgery, with the diagnosis of LIP LESION AND RIGHT EYE LESION  The various methods of treatment have been discussed with the patient and family. After consideration of risks, benefits and other options for treatment, the patient has consented to  Procedure(s): EXCISION LEFT LIP AND RIGHT EYE LESION (Bilateral) as a surgical intervention .  The patient's history has been reviewed, patient examined, no change in status, stable for surgery.  I have reviewed the patient's chart and labs.  Questions were answered to the patient's satisfaction.     NEWMAN, CHRISTOPHER

## 2012-08-06 NOTE — Brief Op Note (Signed)
08/06/2012  8:07 AM  PATIENT:  Chase Hurst  79 y.o. male  PRE-OPERATIVE DIAGNOSIS:  Left Lower Lip Lesion and Right Upper Eyelid Lesion  POST-OPERATIVE DIAGNOSIS:  Left Lower Lip Lesion and Right Upper Eyelid Lesion  PROCEDURE:  Procedure(s): EXCISION LEFT LIP AND RIGHT EYE LESION (Bilateral)  SURGEON:  Surgeon(s) and Role:    * Drema Halon, MD - Primary  PHYSICIAN ASSISTANT:   ASSISTANTS: none   ANESTHESIA:   local  EBL:     BLOOD ADMINISTERED:none  DRAINS: none   LOCAL MEDICATIONS USED:  XYLOCAINE with EPI  SPECIMEN:  Source of Specimen:  left lower lip  DISPOSITION OF SPECIMEN:  PATHOLOGY  COUNTS:  YES  TOURNIQUET:  * No tourniquets in log *  DICTATION: .Other Dictation: Dictation Number [id number]  PLAN OF CARE: Discharge to home after PACU  PATIENT DISPOSITION:  PACU - hemodynamically stable.   Delay start of Pharmacological VTE agent (>24hrs) due to surgical blood loss or risk of bleeding: not applicable

## 2012-08-07 NOTE — Op Note (Signed)
NAME:  [name], [name] NO.:  [id number]  MEDICAL RECORD NO.:  [id number]  LOCATION:                                 FACILITY:  PHYSICIAN:  Kristine Garbe. Ezzard Standing, M.D.DATE OF BIRTH:  October 17, 1926  DATE OF PROCEDURE:  08/06/2012 DATE OF DISCHARGE:  08/06/2012                              OPERATIVE REPORT   PREOPERATIVE DIAGNOSIS: 1. Left lower lip lesion, questionable skin cancer. 2. Right upper eyelid papilloma.  POSTOPERATIVE DIAGNOSIS: 1. Left lower lip lesion, questionable skin cancer. 2. Right upper eyelid papilloma.  PROCEDURE PERFORMED:  Excision of left lower lip lesion 1-1.5 cm with intermediate closure.  Simple excision of right upper eyelid papilloma.  SURGEON:  Kristine Garbe. Ezzard Standing, M.D.  ANESTHESIA:  Local, 1% Xylocaine with 1:100,000 epinephrine.  COMPLICATIONS:  None.  BRIEF CLINICAL NOTE:  Chase Hurst is an 79 year old gentleman who has had a lower lip lesion for about a year.  It gives him a lot trouble when he shaves as he frequently cuts it and it bleeds, has not really been that sore, but he has a nodule with a scabbing on the left lower lip, the skin edge of the mucous membrane.  It measures approximately 1 cm size.  He has also had a papilloma of the right upper eyelid that sometimes falls into his visual field and would like to have this removed at the same time.  DESCRIPTION OF PROCEDURE:  The patient was brought to the operating room.  The lower lip was prepped with Betadine solution and draped out with sterile towels.  The lower lip was then injected with 2 mL of Xylocaine with epinephrine for local anesthetic.  The nodular mass was elliptically excised and sent to Pathology.  Hemostasis was obtained with cautery.  The small defect was then closed with a 5-0 Vicryl subcutaneously and 6-0 nylon to reapproximate the skin edges. Bacitracin ointment was applied.  Next, the small papilloma from the upper eyelid was excised without  local anesthesia.  Had minimal bleeding and bacitracin ointment was applied.  This was not sent to Pathology.  This completed the procedure.  [name] tolerated it well and he was subsequently discharged home later this morning.  He will follow up in my office in 1 week for recheck review pathology and have sutures removed from the lower lip.          ______________________________ Kristine Garbe. Ezzard Standing, M.D.     CEN/MEDQ  D:  08/06/2012  T:  08/06/2012  Job:  [id number]  cc:   Evelena Peat, M.D.

## 2012-08-08 ENCOUNTER — Other Ambulatory Visit (INDEPENDENT_AMBULATORY_CARE_PROVIDER_SITE_OTHER): Payer: Medicare Other

## 2012-08-08 DIAGNOSIS — I10 Essential (primary) hypertension: Secondary | ICD-10-CM

## 2012-08-08 LAB — POC HEMOCCULT BLD/STL (HOME/3-CARD/SCREEN)
Card #3 Fecal Occult Blood, POC: NEGATIVE
Fecal Occult Blood, POC: NEGATIVE

## 2012-08-12 NOTE — Progress Notes (Signed)
Quick Note:  Pt informed ______ 

## 2013-02-13 ENCOUNTER — Encounter: Payer: Self-pay | Admitting: Family Medicine

## 2013-02-13 ENCOUNTER — Ambulatory Visit (INDEPENDENT_AMBULATORY_CARE_PROVIDER_SITE_OTHER): Payer: Medicare Other | Admitting: Family Medicine

## 2013-02-13 VITALS — BP 138/78 | HR 80 | Temp 98.6°F | Ht 64.0 in | Wt 130.0 lb

## 2013-02-13 DIAGNOSIS — E785 Hyperlipidemia, unspecified: Secondary | ICD-10-CM

## 2013-02-13 DIAGNOSIS — Z23 Encounter for immunization: Secondary | ICD-10-CM

## 2013-02-13 DIAGNOSIS — I10 Essential (primary) hypertension: Secondary | ICD-10-CM

## 2013-02-13 DIAGNOSIS — N138 Other obstructive and reflux uropathy: Secondary | ICD-10-CM

## 2013-02-13 DIAGNOSIS — N401 Enlarged prostate with lower urinary tract symptoms: Secondary | ICD-10-CM

## 2013-02-13 NOTE — Progress Notes (Signed)
   Subjective:    Patient ID: Chase Hurst, male    DOB: Jan 07, 1927, 79 y.o.   MRN: [id number]  HPI Patient seen for medical follow up He has history of BPH, hypertension, and hyperlipidemia. 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  He remains on lisinopril HCTZ for hypertension. Blood pressures been well controlled. No orthostasis. No recent chest pains or dyspnea. Takes simvastatin for hyperlipidemia. No myalgias. No cardiac history.  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 He recently retired from [employer]	[place]. He has history of BPH and is symptomatically stable on Avodart. Still has occasional nocturia.  Appetite and weight have been stable. No flu vaccine though he does agree to getting it this year. No contraindications. Had pneumonia vaccine 2 years ago.  He has refused colonoscopies  Past Medical History  Diagnosis Date  . HYPERLIPIDEMIA 02/25/2009  . HYPERTROPHY PROSTATE W/UR OBST \\T \ OTH LUTS 02/25/2009  . Hypertension    Past Surgical History  Procedure Laterality Date  . Hernia repair  1935  . Lesion removal Bilateral 08/06/2012    Procedure: EXCISION LEFT LIP AND RIGHT EYE LESION;  Surgeon: Drema Halon, MD;  Location: [facility];  Service: ENT;  Laterality: Bilateral;    reports that he has never smoked. He does not have any smokeless tobacco history on file. His alcohol and drug histories are not on file. family history is not on file. No Known Allergies    Review of Systems  Constitutional: Negative for fatigue.  Eyes: Negative for visual disturbance.  Respiratory: Negative for cough, chest tightness and shortness of breath.   Cardiovascular: Negative for chest pain, palpitations and leg swelling.  Gastrointestinal: Negative for abdominal pain.  Endocrine: Negative for polydipsia and polyuria.  Neurological: Negative  for dizziness, syncope, weakness, light-headedness and headaches.  Psychiatric/Behavioral: Negative for dysphoric mood.       Objective:   Physical Exam  Constitutional: He appears well-developed and well-nourished.  HENT:  Mouth/Throat: Oropharynx is clear and moist.  Patient is very small scar left lower lip region. No evidence for recurrent skin cancer  Neck: Neck supple. No thyromegaly present.  Cardiovascular: Normal rate and regular rhythm.   Murmur heard. Pulmonary/Chest: Effort normal and breath sounds normal. No respiratory distress. He has no wheezes. He has no rales.  Musculoskeletal: He exhibits no edema.  Lymphadenopathy:    He has no cervical adenopathy.  Neurological: He is alert.          Assessment & Plan:  #1 hypertension. Adequately controlled. Continue lisinopril HCTZ #2 hyperlipidemia. Continue simvastatin #3 history of BPH symptomatically stable on Avodart

## 2013-02-13 NOTE — Progress Notes (Signed)
Pre visit review using our clinic review tool, if applicable. No additional management support is needed unless otherwise documented below in the visit note. 

## 2013-02-13 NOTE — Patient Instructions (Signed)
Let's plan on follow up in 6 months.

## 2013-04-11 ENCOUNTER — Ambulatory Visit (INDEPENDENT_AMBULATORY_CARE_PROVIDER_SITE_OTHER): Payer: Medicare HMO | Admitting: Family Medicine

## 2013-04-11 ENCOUNTER — Encounter: Payer: Self-pay | Admitting: Family Medicine

## 2013-04-11 VITALS — BP 130/76 | HR 74 | Temp 97.7°F | Wt 132.0 lb

## 2013-04-11 DIAGNOSIS — N62 Hypertrophy of breast: Secondary | ICD-10-CM

## 2013-04-11 DIAGNOSIS — N63 Unspecified lump in unspecified breast: Secondary | ICD-10-CM

## 2013-04-11 DIAGNOSIS — N6310 Unspecified lump in the right breast, unspecified quadrant: Secondary | ICD-10-CM

## 2013-04-11 NOTE — Patient Instructions (Signed)
Your right breast enlargement is very likely related to gynecomastia related to Avodart We will obtain mammogram and ultrasound to be safe.

## 2013-04-11 NOTE — Progress Notes (Signed)
   Subjective:    Patient ID: Chase Hurst, male    DOB: 02-03-1927, 80 y.o.   MRN: [id number]  HPI Patient seen with right breast mass. He takes Avodart for BPH and has had some tendency toward gynecomastia in the past. recently noticed some enlargement of the right breast with some associated tenderness. Denies any nipple discharge. No adenopathy. No nipple retraction. He has definite asymmetry the right breast compared to left.  He's not any recent appetite or weight changes. No bleeding from the nipple.  Past Medical History  Diagnosis Date  . HYPERLIPIDEMIA 02/25/2009  . HYPERTROPHY PROSTATE W/UR OBST \\T \ OTH LUTS 02/25/2009  . Hypertension    Past Surgical History  Procedure Laterality Date  . Hernia repair  1935  . Lesion removal Bilateral 08/06/2012    Procedure: EXCISION LEFT LIP AND RIGHT EYE LESION;  Surgeon: Rozetta Nunnery, MD;  Location: [place] [facility];  Service: ENT;  Laterality: Bilateral;    reports that he has never smoked. He does not have any smokeless tobacco history on file. His alcohol and drug histories are not on file. family history is not on file. No Known Allergies    Review of Systems  Constitutional: Negative for fever, chills, appetite change and unexpected weight change.  Respiratory: Negative for cough and shortness of breath.   Neurological: Negative for headaches.  Hematological: Negative for adenopathy.       Objective:   Physical Exam  Constitutional: He appears well-developed and well-nourished.  Cardiovascular: Normal rate.   Pulmonary/Chest: Effort normal and breath sounds normal. No respiratory distress. He has no wheezes. He has no rales.  Breast exam reveals some obvious asymmetry with right greater than left breast enlargement. He has no evidence for any nipple retraction. No nipple discharge. He has some diffuse enlargement of the right breast which is mobile and slightly tender. Minimal glandular enlargement the  left. No right axillary adenopathy or chest wall adenopathy          Assessment & Plan:  Right breast mass/gynecomastia. We explained this is very likely related to Avodart (asymmetric gynecomastia). The fact that this is tender and mobile is in his favor. We highly favor gynecomastia over worrisome mass but would get mammogram and ultrasound to be safe.

## 2013-04-11 NOTE — Progress Notes (Signed)
Pre visit review using our clinic review tool, if applicable. No additional management support is needed unless otherwise documented below in the visit note. 

## 2013-04-14 ENCOUNTER — Other Ambulatory Visit: Payer: Self-pay | Admitting: Family Medicine

## 2013-04-14 ENCOUNTER — Ambulatory Visit
Admission: RE | Admit: 2013-04-14 | Discharge: 2013-04-14 | Disposition: A | Discharge status: Home / Self Care | Payer: Self-pay | Source: Ambulatory Visit | Attending: Family Medicine | Admitting: Family Medicine

## 2013-04-14 ENCOUNTER — Other Ambulatory Visit: Payer: Self-pay

## 2013-04-14 ENCOUNTER — Ambulatory Visit
Admission: RE | Admit: 2013-04-14 | Discharge: 2013-04-14 | Disposition: A | Discharge status: Home / Self Care | Payer: Medicare HMO | Source: Ambulatory Visit | Attending: Family Medicine | Admitting: Family Medicine

## 2013-04-14 DIAGNOSIS — N6310 Unspecified lump in the right breast, unspecified quadrant: Secondary | ICD-10-CM

## 2013-05-14 ENCOUNTER — Telehealth: Payer: Self-pay | Admitting: Family Medicine

## 2013-05-14 DIAGNOSIS — I10 Essential (primary) hypertension: Secondary | ICD-10-CM

## 2013-05-14 MED ORDER — LISINOPRIL-HYDROCHLOROTHIAZIDE 10-12.5 MG PO TABS: 1.0000 | ORAL_TABLET | Freq: Every day | ORAL | Status: DC

## 2013-05-14 NOTE — Telephone Encounter (Signed)
Pt is needing new rx lisinopril-hydrochlorothiazide (PRINZIDE,ZESTORETIC) 10-12.5 MG per tablet, 90 day  Supply, sent to new pharmacy [facility], fax# [phone].

## 2013-05-14 NOTE — Telephone Encounter (Signed)
RX sent to pharmacy  

## 2013-08-14 ENCOUNTER — Ambulatory Visit (INDEPENDENT_AMBULATORY_CARE_PROVIDER_SITE_OTHER): Payer: Medicare HMO | Admitting: Family Medicine

## 2013-08-14 ENCOUNTER — Encounter: Payer: Self-pay | Admitting: Family Medicine

## 2013-08-14 ENCOUNTER — Telehealth: Payer: Self-pay | Admitting: Family Medicine

## 2013-08-14 VITALS — BP 140/70 | HR 80 | Wt 128.0 lb

## 2013-08-14 DIAGNOSIS — E785 Hyperlipidemia, unspecified: Secondary | ICD-10-CM

## 2013-08-14 DIAGNOSIS — N138 Other obstructive and reflux uropathy: Secondary | ICD-10-CM

## 2013-08-14 DIAGNOSIS — R062 Wheezing: Secondary | ICD-10-CM

## 2013-08-14 DIAGNOSIS — N401 Enlarged prostate with lower urinary tract symptoms: Secondary | ICD-10-CM

## 2013-08-14 DIAGNOSIS — I10 Essential (primary) hypertension: Secondary | ICD-10-CM

## 2013-08-14 LAB — HEPATIC FUNCTION PANEL
ALK PHOS: 48 U/L (ref 39–117)
ALT: 20 U/L (ref 0–53)
AST: 31 U/L (ref 0–37)
Albumin: 4.1 g/dL (ref 3.5–5.2)
BILIRUBIN DIRECT: 0.2 mg/dL (ref 0.0–0.3)
BILIRUBIN TOTAL: 1.2 mg/dL (ref 0.2–1.2)
Total Protein: 7.2 g/dL (ref 6.0–8.3)

## 2013-08-14 LAB — LIPID PANEL
CHOLESTEROL: 205 mg/dL — AB (ref 0–200)
HDL: 106.7 mg/dL (ref >39.00)
LDL Cholesterol: 91 mg/dL (ref 0–99)
NONHDL: 98.3
Total CHOL/HDL Ratio: 2
Triglycerides: 36 mg/dL (ref 0.0–149.0)
VLDL: 7.2 mg/dL (ref 0.0–40.0)

## 2013-08-14 LAB — BASIC METABOLIC PANEL
BUN: 11 mg/dL (ref 6–23)
CALCIUM: 10 mg/dL (ref 8.4–10.5)
CO2: 28 mEq/L (ref 19–32)
CREATININE: 0.8 mg/dL (ref 0.4–1.5)
Chloride: 95 mEq/L — ABNORMAL LOW (ref 96–112)
GFR: 94.94 mL/min (ref >60.00)
GLUCOSE: 77 mg/dL (ref 70–99)
POTASSIUM: 4 meq/L (ref 3.5–5.1)
Sodium: 135 mEq/L (ref 135–145)

## 2013-08-14 NOTE — Progress Notes (Signed)
   Subjective:    Patient ID: Chase Hurst, male    DOB: Nov 28, 1926, 80 y.o.   MRN: [id number]  HPI Medical followup. Patient has history of hypertension, hyperlipidemia, BPH, medications reviewed. He has still some nocturia but overall BPH symptoms are stable. He remains on Avodart. He takes lisinopril HCTZ for hypertension. Blood pressure stable. No headaches. No chest pains. Takes simvastatin for hyperlipidemia. No history of CAD or peripheral vascular disease.  Acute problem of yellowish postnasal drip 2-3 weeks. No facial pain. No headaches. No fevers or chills. Taking over-the-counter medications and symptoms improving past couple days. Only occasional cough  Past Medical History  Diagnosis Date  . HYPERLIPIDEMIA 02/25/2009  . HYPERTROPHY PROSTATE W/UR OBST \\T \ OTH LUTS 02/25/2009  . Hypertension    Past Surgical History  Procedure Laterality Date  . Hernia repair  1935  . Lesion removal Bilateral 08/06/2012    Procedure: EXCISION LEFT LIP AND RIGHT EYE LESION;  Surgeon: Rozetta Nunnery, MD;  Location: [place];  Service: ENT;  Laterality: Bilateral;    reports that he has never smoked. He does not have any smokeless tobacco history on file. His alcohol and drug histories are not on file. family history is not on file. No Known Allergies    Review of Systems  Constitutional: Negative for fatigue.  HENT: Positive for congestion.   Eyes: Negative for visual disturbance.  Respiratory: Positive for cough and wheezing. Negative for chest tightness and shortness of breath.   Cardiovascular: Negative for chest pain, palpitations and leg swelling.  Neurological: Negative for dizziness, syncope, weakness, light-headedness and headaches.       Objective:   Physical Exam  Constitutional: He is oriented to person, place, and time. He appears well-developed and well-nourished.  HENT:  Right Ear: External ear normal.  Left Ear: External ear normal.    Mouth/Throat: Oropharynx is clear and moist.  Eyes: Pupils are equal, round, and reactive to light.  Neck: Neck supple. No thyromegaly present.  Cardiovascular: Normal rate and regular rhythm.   Pulmonary/Chest: Effort normal. No respiratory distress. He has wheezes. He has no rales.  Musculoskeletal: He exhibits no edema.  Neurological: He is alert and oriented to person, place, and time.          Assessment & Plan:  #1 hypertension. Stable. Continue current medication. Check basic metabolic panel #2 hyperlipidemia. Check lipid and hepatic panel #3 BPH. Symptomatically stable #4 URI with reactive airway component. We discussed possible steroids at this point he wishes to wait. He does have some wheezing but is not having any significant dyspnea.

## 2013-08-14 NOTE — Progress Notes (Signed)
Pre visit review using our clinic review tool, if applicable. No additional management support is needed unless otherwise documented below in the visit note. 

## 2013-08-14 NOTE — Telephone Encounter (Signed)
Relevant patient education mailed to patient.  

## 2013-11-03 ENCOUNTER — Telehealth: Payer: Self-pay | Admitting: Family Medicine

## 2013-11-03 MED ORDER — DUTASTERIDE 0.5 MG PO CAPS: ORAL_CAPSULE | ORAL | Status: DC

## 2013-11-03 MED ORDER — SIMVASTATIN 20 MG PO TABS: ORAL_TABLET | ORAL | Status: DC

## 2013-11-03 MED ORDER — LISINOPRIL-HYDROCHLOROTHIAZIDE 10-12.5 MG PO TABS: 1.0000 | ORAL_TABLET | Freq: Every day | ORAL | Status: DC

## 2013-11-03 NOTE — Telephone Encounter (Signed)
Pt has switched insurance co and needs new rx sent to them. simvastatin (ZOCOR) 20 MG tablet  dutasteride (AVODART) 0.5 MG capsule lisinopril-hydrochlorothiazide (PRINZIDE,ZESTORETIC) 10-12.5 MG per tablet 90 day sent to [facility]  [phone] Fax  [phone]

## 2013-11-03 NOTE — Telephone Encounter (Signed)
Rx's sent to mail order.  

## 2014-02-09 ENCOUNTER — Ambulatory Visit (INDEPENDENT_AMBULATORY_CARE_PROVIDER_SITE_OTHER): Payer: Medicare HMO

## 2014-02-09 ENCOUNTER — Ambulatory Visit (INDEPENDENT_AMBULATORY_CARE_PROVIDER_SITE_OTHER): Payer: Medicare HMO | Admitting: Family Medicine

## 2014-02-09 ENCOUNTER — Encounter: Payer: Self-pay | Admitting: Family Medicine

## 2014-02-09 VITALS — BP 128/70 | HR 84 | Temp 97.6°F | Wt 132.0 lb

## 2014-02-09 DIAGNOSIS — L820 Inflamed seborrheic keratosis: Secondary | ICD-10-CM

## 2014-02-09 DIAGNOSIS — N4889 Other specified disorders of penis: Secondary | ICD-10-CM

## 2014-02-09 DIAGNOSIS — Z23 Encounter for immunization: Secondary | ICD-10-CM

## 2014-02-09 DIAGNOSIS — I10 Essential (primary) hypertension: Secondary | ICD-10-CM

## 2014-02-09 MED ORDER — TRIAMCINOLONE ACETONIDE 0.1 % EX CREA: 1.0000 "application " | TOPICAL_CREAM | Freq: Two times a day (BID) | CUTANEOUS | Status: DC | PRN

## 2014-02-09 NOTE — Progress Notes (Signed)
Pre visit review using our clinic review tool, if applicable. No additional management support is needed unless otherwise documented below in the visit note. 

## 2014-02-09 NOTE — Patient Instructions (Signed)
Seborrheic Keratosis Seborrheic keratosis is a common, noncancerous (benign) skin growth that can occur anywhere on the skin.It looks like "stuck-on," waxy, rough, tan, brown, or black spots on the skin. These skin growths can be flat or raised.They are often called "barnacles" because of their pasted-on appearance.Usually, these skin growths appear in adulthood, around age 78, and increase in number as you age. They may also develop during pregnancy or following estrogen therapy. Many people may only have one growth appear in their lifetime, while some people may develop many growths. CAUSES It is unknown what causes these skin growths, but they appear to run in families. SYMPTOMS Seborrheic keratosis is often located on the face, chest, shoulders, back, or other areas. These growths are:  Usually painless, but may become irritated and itchy.  Yellow, brown, black, or other colors.  Slightly raised or have a flat surface.  Sometimes rough or wart-like in texture.  Often waxy on the surface.  Round or oval-shaped.  Sometimes "stuck-on" in appearance.  Sometimes single, but there are usually many growths. Any growth that bleeds, itches on a regular basis, becomes inflamed, or becomes irritated needs to be evaluated by a skin specialist (dermatologist). DIAGNOSIS Diagnosis is mainly based on the way the growths appear. In some cases, it can be difficult to tell this type of skin growth from skin cancer. A skin growth tissue sample (biopsy) may be used to confirm the diagnosis. TREATMENT Most often, treatment is not needed because the skin growths are benign.If the skin growth is irritated easily by clothing or jewelry, causing it to scab or bleed, treatment may be recommended. Patients may also choose to have the growths removed because they do not like their appearance. Most commonly, these growths are treated with cryosurgery. In cryosurgery, liquid nitrogen is applied to "freeze" the  growth. The growth usually falls off within a matter of days. A blister may form and dry into a scab that will also fall off. After the growth or scab falls off, it may leave a dark or light spot on the skin. This color may fade over time, or it may remain permanent on the skin. HOME CARE INSTRUCTIONS If the skin growths are treated with cryosurgery, the treated area needs to be kept clean with water and soap. SEEK MEDICAL CARE IF:  You have questions about these growths or other skin problems.  You develop new symptoms, including:  A change in the appearance of the skin growth.  New growths.  Any bleeding, itching, or pain in the growths.  A skin growth that looks similar to seborrheic keratosis. Document Released: 03/25/2010 Document Revised: 05/15/2011 Document Reviewed: 03/25/2010 Exit[employer] Patient Information 2015 [employer]. This information is not intended to replace advice given to you by your health care provider. Make sure you discuss any questions you have with your health care provider.  

## 2014-02-09 NOTE — Progress Notes (Signed)
   Subjective:    Patient ID: Chase Hurst, male    DOB: 19-Mar-1926, 80 y.o.   MRN: [id number]  HPI Patient seen for the following issues  Scaly area on his left upper back that has been itching for several months. His wife notices recently. No alleviating factors. He also has several scaly patches on his head that are bothersome. Has not tried any recent moisturizing creams  He has noticed a couple of times when taking hot baths or showers had little bit of bleeding from tip of his foreskin. He is uncircumcised. He has never seen any gross hematuria. No burning with urination. No discharge.    Needs flu vaccine. Also no history of Prevnar 13. Hypertension which has been well controlled with lisinopril HCTZ. No recent dizziness. No chest pains.  Past Medical History  Diagnosis Date  . HYPERLIPIDEMIA 02/25/2009  . HYPERTROPHY PROSTATE W/UR OBST \\T \ OTH LUTS 02/25/2009  . Hypertension    Past Surgical History  Procedure Laterality Date  . Hernia repair  1935  . Lesion removal Bilateral 08/06/2012    Procedure: EXCISION LEFT LIP AND RIGHT EYE LESION;  Surgeon: Rozetta Nunnery, MD;  Location: [place];  Service: ENT;  Laterality: Bilateral;    reports that he has never smoked. He does not have any smokeless tobacco history on file. His alcohol and drug histories are not on file. family history is not on file. No Known Allergies      Review of Systems  Constitutional: Negative for fatigue.  Eyes: Negative for visual disturbance.  Respiratory: Negative for cough, chest tightness and shortness of breath.   Cardiovascular: Negative for chest pain, palpitations and leg swelling.  Neurological: Negative for dizziness, syncope, weakness, light-headedness and headaches.       Objective:   Physical Exam  Constitutional: He is oriented to person, place, and time. He appears well-developed and well-nourished.  HENT:  Right Ear: External ear normal.  Left Ear:  External ear normal.  Mouth/Throat: Oropharynx is clear and moist.  Eyes: Pupils are equal, round, and reactive to light.  Neck: Neck supple. No thyromegaly present.  Cardiovascular: Normal rate and regular rhythm.   Pulmonary/Chest: Effort normal and breath sounds normal. No respiratory distress. He has no wheezes. He has no rales.  Genitourinary:  Uncircumcised male. No penile lesions noted. He does appear to have a very small split in the skin of the distal aspect of the overlying foreskin. No active bleeding at this time  Musculoskeletal: He exhibits no edema.  Neurological: He is alert and oriented to person, place, and time.  Skin:  Patient has well-demarcated brownish scaly lesions on his back which is area in question. He has several similar lesions on his head consistent with seborrheic keratoses          Assessment & Plan:  #1 seborrheic keratosis left upper back. Reassured this is benign. He does have generally very dry scan we've recommended moisturizer and triamcinolone 0.1% cream to use as needed #2 minimal irritation foreskin. He appears to have a very small fissure /split distal foreskin. No concerning lesions. Observe for now  #3 health maintenance- flu vaccine given. We've also recommended Prevnar 13. He's had previous Pneumovax. #4 hypertension- stable and at goal.

## 2014-06-22 ENCOUNTER — Telehealth: Payer: Self-pay | Admitting: Family Medicine

## 2014-06-22 MED ORDER — DUTASTERIDE 0.5 MG PO CAPS: ORAL_CAPSULE | ORAL | Status: DC

## 2014-06-22 MED ORDER — LISINOPRIL-HYDROCHLOROTHIAZIDE 10-12.5 MG PO TABS: 1.0000 | ORAL_TABLET | Freq: Every day | ORAL | Status: DC

## 2014-06-22 MED ORDER — SIMVASTATIN 20 MG PO TABS: ORAL_TABLET | ORAL | Status: DC

## 2014-06-22 NOTE — Telephone Encounter (Signed)
Rx sent to pharmacy   

## 2014-06-22 NOTE — Telephone Encounter (Signed)
[id number] at [facility] Fax: [phone]   Phone: [phone]   Due to pt's insurance, pt now needs to use this pharm. Would like all meds sent there. Right now needs:  dutasteride (AVODART) 0.5 MG capsule lisinopril-hydrochlorothiazide (PRINZIDE,ZESTORETIC) 10-12.5 MG per tablet simvastatin (ZOCOR) 20 MG tablet 90 day each

## 2014-08-28 ENCOUNTER — Encounter: Payer: Self-pay | Admitting: Family Medicine

## 2014-08-28 ENCOUNTER — Ambulatory Visit (INDEPENDENT_AMBULATORY_CARE_PROVIDER_SITE_OTHER): Payer: Medicare HMO | Admitting: Family Medicine

## 2014-08-28 VITALS — BP 128/70 | HR 82 | Temp 97.4°F | Ht 64.17 in | Wt 127.0 lb

## 2014-08-28 DIAGNOSIS — I10 Essential (primary) hypertension: Secondary | ICD-10-CM

## 2014-08-28 DIAGNOSIS — E785 Hyperlipidemia, unspecified: Secondary | ICD-10-CM | POA: Diagnosis not present

## 2014-08-28 DIAGNOSIS — Z0000 Encounter for general adult medical examination without abnormal findings: Secondary | ICD-10-CM | POA: Diagnosis not present

## 2014-08-28 LAB — CBC WITH DIFFERENTIAL/PLATELET
BASOS PCT: 0.5 % (ref 0.0–3.0)
Basophils Absolute: 0.1 10*3/uL (ref 0.0–0.1)
EOS PCT: 0.3 % (ref 0.0–5.0)
Eosinophils Absolute: 0 10*3/uL (ref 0.0–0.7)
HEMATOCRIT: 42.9 % (ref 39.0–52.0)
HEMOGLOBIN: 14.5 g/dL (ref 13.0–17.0)
LYMPHS PCT: 17.2 % (ref 12.0–46.0)
Lymphs Abs: 2.1 10*3/uL (ref 0.7–4.0)
MCHC: 33.8 g/dL (ref 30.0–36.0)
MCV: 100.9 fl — ABNORMAL HIGH (ref 78.0–100.0)
MONOS PCT: 10.6 % (ref 3.0–12.0)
Monocytes Absolute: 1.3 10*3/uL — ABNORMAL HIGH (ref 0.1–1.0)
NEUTROS ABS: 8.7 10*3/uL — AB (ref 1.4–7.7)
Neutrophils Relative %: 71.4 % (ref 43.0–77.0)
Platelets: 190 10*3/uL (ref 150.0–400.0)
RBC: 4.25 Mil/uL (ref 4.22–5.81)
RDW: 13.3 % (ref 11.5–15.5)
WBC: 12.2 10*3/uL — ABNORMAL HIGH (ref 4.0–10.5)

## 2014-08-28 LAB — HEPATIC FUNCTION PANEL
ALBUMIN: 4.5 g/dL (ref 3.5–5.2)
ALT: 64 U/L — ABNORMAL HIGH (ref 0–53)
AST: 84 U/L — ABNORMAL HIGH (ref 0–37)
Alkaline Phosphatase: 49 U/L (ref 39–117)
Bilirubin, Direct: 0.3 mg/dL (ref 0.0–0.3)
Total Bilirubin: 1.3 mg/dL — ABNORMAL HIGH (ref 0.2–1.2)
Total Protein: 7.6 g/dL (ref 6.0–8.3)

## 2014-08-28 LAB — BASIC METABOLIC PANEL
BUN: 10 mg/dL (ref 6–23)
CHLORIDE: 96 meq/L (ref 96–112)
CO2: 31 meq/L (ref 19–32)
CREATININE: 0.85 mg/dL (ref 0.40–1.50)
Calcium: 10 mg/dL (ref 8.4–10.5)
GFR: 90.86 mL/min (ref >60.00)
GLUCOSE: 95 mg/dL (ref 70–99)
POTASSIUM: 3.9 meq/L (ref 3.5–5.1)
Sodium: 138 mEq/L (ref 135–145)

## 2014-08-28 LAB — LIPID PANEL
CHOL/HDL RATIO: 2
CHOLESTEROL: 232 mg/dL — AB (ref 0–200)
HDL: 102.4 mg/dL (ref >39.00)
LDL CALC: 113 mg/dL — AB (ref 0–99)
NonHDL: 129.6
Triglycerides: 83 mg/dL (ref 0.0–149.0)
VLDL: 16.6 mg/dL (ref 0.0–40.0)

## 2014-08-28 LAB — TSH: TSH: 2.36 u[IU]/mL (ref 0.35–4.50)

## 2014-08-28 NOTE — Patient Instructions (Signed)
Be sure to take Lisinopril- HCTZ in the morning and not at night. Continue with yearly flu vaccine.

## 2014-08-28 NOTE — Progress Notes (Signed)
Subjective:    Patient ID: ADI DORO, male    DOB: April 24, 1926, 81 y.o.   MRN: [id number]  HPI Patient here for complete physical. Has history of BPH, hyperlipidemia, erectile dysfunction, hypertension. He remains active with multiple hobbies including beekeeping,  amature radio.  Immunizations up-to-date. He's never had colonoscopy and declines.  Continues to have issues with frequent nocturia. He apparently has been taking his lisinopril HCTZ at night and will he is encouraged take this in the morning instead. He's had some bilateral upper extremity tremor which is worse with activity and gradually increasing of the past year. Some decline in appetite recently. Mild weight loss 5 pounds since last visit. Denies any pain. No cough. No headache.  Past Medical History  Diagnosis Date  . HYPERLIPIDEMIA 02/25/2009  . HYPERTROPHY PROSTATE W/UR OBST \\T \ OTH LUTS 02/25/2009  . Hypertension    Past Surgical History  Procedure Laterality Date  . Hernia repair  1935  . Lesion removal Bilateral 08/06/2012    Procedure: EXCISION LEFT LIP AND RIGHT EYE LESION;  Surgeon: Rozetta Nunnery, MD;  Location: [facility];  Service: ENT;  Laterality: Bilateral;    reports that he has never smoked. He does not have any smokeless tobacco history on file. His alcohol and drug histories are not on file. family history is not on file. No Known Allergies    Review of Systems  Constitutional: Positive for appetite change. Negative for fever, activity change and fatigue.  HENT: Negative for congestion, ear pain and trouble swallowing.   Eyes: Negative for pain and visual disturbance.  Respiratory: Negative for cough, shortness of breath and wheezing.   Cardiovascular: Negative for chest pain and palpitations.  Gastrointestinal: Negative for nausea, vomiting, abdominal pain, diarrhea, constipation, blood in stool, abdominal distention and rectal pain.  Genitourinary: Positive for  urgency. Negative for dysuria, hematuria and testicular pain.  Musculoskeletal: Negative for joint swelling and arthralgias.  Skin: Negative for rash.  Neurological: Positive for tremors and weakness (Generalized). Negative for dizziness, syncope and headaches.  Hematological: Negative for adenopathy.  Psychiatric/Behavioral: Positive for sleep disturbance. Negative for confusion and dysphoric mood. The patient is nervous/anxious.        Objective:   Physical Exam  Constitutional: He is oriented to person, place, and time. He appears well-developed and well-nourished. No distress.  HENT:  Head: Normocephalic and atraumatic.  Right Ear: External ear normal.  Left Ear: External ear normal.  Mouth/Throat: Oropharynx is clear and moist.  Eyes: Conjunctivae and EOM are normal. Pupils are equal, round, and reactive to light.  Neck: Normal range of motion. Neck supple. No thyromegaly present.  Cardiovascular: Normal rate, regular rhythm and normal heart sounds.   No murmur heard. Pulmonary/Chest: No respiratory distress. He has no wheezes. He has no rales.  Abdominal: Soft. Bowel sounds are normal. He exhibits no distension and no mass. There is no tenderness. There is no rebound and no guarding.  Musculoskeletal: He exhibits no edema.  Lymphadenopathy:    He has no cervical adenopathy.  Neurological: He is alert and oriented to person, place, and time. He displays normal reflexes. No cranial nerve deficit.  upper extremity tremor which does not extinguish with movement. No cogwheel rigidity. Normal gait. No focal weakness.  Skin: No rash noted.  Psychiatric: He has a normal mood and affect.          Assessment & Plan:  Complete physical. Obtain screening lab work. Continue yearly flu vaccine. We've suggested take  his lisinopril HCTZ in the morning and not at night to reduce possible nocturia. 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 We discussed possible  use of low-dose beta blocker or possibly Mysoline but at this point is not interested. Would not check PSA given his age. He is not interested in colonoscopy screening at his age

## 2014-08-28 NOTE — Progress Notes (Signed)
Pre visit review using our clinic review tool, if applicable. No additional management support is needed unless otherwise documented below in the visit note. 

## 2014-08-31 ENCOUNTER — Other Ambulatory Visit: Payer: Self-pay | Admitting: Family Medicine

## 2014-08-31 DIAGNOSIS — R7401 Elevation of levels of liver transaminase levels: Secondary | ICD-10-CM

## 2014-08-31 DIAGNOSIS — R740 Nonspecific elevation of levels of transaminase and lactic acid dehydrogenase [LDH]: Principal | ICD-10-CM

## 2014-09-28 ENCOUNTER — Encounter: Payer: Self-pay | Admitting: Family Medicine

## 2014-09-28 ENCOUNTER — Ambulatory Visit (INDEPENDENT_AMBULATORY_CARE_PROVIDER_SITE_OTHER): Payer: Medicare HMO | Admitting: Family Medicine

## 2014-09-28 VITALS — BP 126/68 | HR 80 | Temp 97.7°F | Wt 127.0 lb

## 2014-09-28 DIAGNOSIS — R7401 Elevation of levels of liver transaminase levels: Secondary | ICD-10-CM

## 2014-09-28 DIAGNOSIS — R740 Nonspecific elevation of levels of transaminase and lactic acid dehydrogenase [LDH]: Secondary | ICD-10-CM

## 2014-09-28 LAB — HEPATIC FUNCTION PANEL
ALBUMIN: 4.2 g/dL (ref 3.5–5.2)
ALK PHOS: 46 U/L (ref 39–117)
ALT: 36 U/L (ref 0–53)
AST: 39 U/L — ABNORMAL HIGH (ref 0–37)
BILIRUBIN DIRECT: 0.3 mg/dL (ref 0.0–0.3)
BILIRUBIN TOTAL: 1.1 mg/dL (ref 0.2–1.2)
Total Protein: 6.9 g/dL (ref 6.0–8.3)

## 2014-09-28 NOTE — Progress Notes (Signed)
   Subjective:    Patient ID: Chase Hurst, male    DOB: 04-29-1926, 81 y.o.   MRN: [id number]  HPI Patient seen for follow-up elevated liver transaminases.  This did not been elevated previously. Is not on any new medications. Denies any recent abdominal pain. No history of any prior blood transfusions. He does drink brandy generally 2 "shots "per night and he is not certain but thinks that he shot has about 2 ounces of alcohol. He has had in general poor appetite but no nausea or vomiting. No stool changes. No fever or chills.  Past Medical History  Diagnosis Date  . HYPERLIPIDEMIA 02/25/2009  . HYPERTROPHY PROSTATE W/UR OBST \\T \ OTH LUTS 02/25/2009  . Hypertension    Past Surgical History  Procedure Laterality Date  . Hernia repair  1935  . Lesion removal Bilateral 08/06/2012    Procedure: EXCISION LEFT LIP AND RIGHT EYE LESION;  Surgeon: Rozetta Nunnery, MD;  Location: [place];  Service: ENT;  Laterality: Bilateral;    reports that he has never smoked. He does not have any smokeless tobacco history on file. His alcohol and drug histories are not on file. family history is not on file. No Known Allergies    Review of Systems  Constitutional: Negative for fever and chills.  Respiratory: Negative for shortness of breath.   Gastrointestinal: Negative for nausea, vomiting, abdominal pain and diarrhea.       Objective:   Physical Exam  Constitutional: He appears well-developed and well-nourished.  Eyes: No scleral icterus.  Cardiovascular: Normal rate and regular rhythm.   Pulmonary/Chest: Effort normal and breath sounds normal. No respiratory distress. He has no wheezes. He has no rales.  Abdominal: Soft. Bowel sounds are normal. He exhibits no distension and no mass. There is no tenderness. There is no rebound and no guarding.          Assessment & Plan:  Mild elevated liver transaminases. With disproportionate AST greater than ALT, suspect this  may be related to alcohol. He has not had any history of abuse previously. We've recommended scaling back alcohol consumption. Repeat liver transaminases today.

## 2014-09-28 NOTE — Progress Notes (Signed)
Pre visit review using our clinic review tool, if applicable. No additional management support is needed unless otherwise documented below in the visit note. 

## 2014-09-28 NOTE — Patient Instructions (Signed)
Try to reduce alcohol consumption (Brandy) to no more than 2-3 ounces per day

## 2014-09-28 NOTE — Addendum Note (Signed)
Addended by: Elmer Picker on: 09/28/2014 08:51 AM   Modules accepted: Orders

## 2015-03-30 ENCOUNTER — Other Ambulatory Visit: Payer: Self-pay | Admitting: Family Medicine

## 2015-05-06 ENCOUNTER — Encounter: Payer: Self-pay | Admitting: Family Medicine

## 2015-05-06 ENCOUNTER — Ambulatory Visit (INDEPENDENT_AMBULATORY_CARE_PROVIDER_SITE_OTHER): Payer: Medicare HMO | Admitting: Family Medicine

## 2015-05-06 VITALS — BP 120/78 | HR 123 | Temp 98.0°F | Ht 64.0 in | Wt 131.5 lb

## 2015-05-06 DIAGNOSIS — N400 Enlarged prostate without lower urinary tract symptoms: Secondary | ICD-10-CM

## 2015-05-06 DIAGNOSIS — I10 Essential (primary) hypertension: Secondary | ICD-10-CM

## 2015-05-06 DIAGNOSIS — K1379 Other lesions of oral mucosa: Secondary | ICD-10-CM | POA: Diagnosis not present

## 2015-05-06 MED ORDER — DUTASTERIDE 0.5 MG PO CAPS: ORAL_CAPSULE | ORAL | Status: DC

## 2015-05-06 MED ORDER — MAGIC MOUTHWASH: ORAL | Status: DC

## 2015-05-06 NOTE — Progress Notes (Signed)
   Subjective:    Patient ID: Chase Hurst, male    DOB: 12-22-1926, 82 y.o.   MRN: [id number]  HPI Patient seen for the following issues  New problem of six-day history of mouth soreness. He initially noticed some soreness of the tongue but subsequently involvement of buccal mucosa and posterior pharynx. Has not noted any exudate. He felt his tongue might have been slightly swollen several days ago but never noticed any lip edema. No fever. No chills. No recent antibiotics. No change in medications. Swallowing okay. No dysphagia. No lymphadenopathy. No change of toothpaste Tried saltwater gargles without improvement  History of BPH. Symptoms controlled with Avodart. Requesting refills.  Hypertension treated with lisinopril HCTZ. Blood pressures been stable. No dizziness. No headaches. No chest pains. Compliant with therapy.  Past Medical History  Diagnosis Date  . HYPERLIPIDEMIA 02/25/2009  . HYPERTROPHY PROSTATE W/UR OBST \\T \ OTH LUTS 02/25/2009  . Hypertension    Past Surgical History  Procedure Laterality Date  . Hernia repair  1935  . Lesion removal Bilateral 08/06/2012    Procedure: EXCISION LEFT LIP AND RIGHT EYE LESION;  Surgeon: Rozetta Nunnery, MD;  Location: [place];  Service: ENT;  Laterality: Bilateral;    reports that he has never smoked. He does not have any smokeless tobacco history on file. His alcohol and drug histories are not on file. family history is not on file. No Known Allergies    Review of Systems  Constitutional: Negative for fever, chills, appetite change and unexpected weight change.  HENT: Negative for trouble swallowing.   Respiratory: Negative for shortness of breath.   Cardiovascular: Negative for chest pain.  Genitourinary: Negative for frequency, hematuria and difficulty urinating.  Skin: Negative for rash.  Neurological: Negative for syncope and headaches.  Hematological: Negative for adenopathy.         Objective:   Physical Exam  Constitutional: He appears well-developed and well-nourished.  HENT:  Right Ear: External ear normal.  Left Ear: External ear normal.  Mouth/Throat: Oropharynx is clear and moist.  Neck: Neck supple. No thyromegaly present.  Cardiovascular: Normal rate and regular rhythm.   Pulmonary/Chest: Effort normal and breath sounds normal. No respiratory distress. He has no wheezes. He has no rales.  Musculoskeletal: He exhibits no edema.  Lymphadenopathy:    He has no cervical adenopathy.          Assessment & Plan:  #1 mouth soreness. No evidence for any ulcers or other abnormalities other than some very minimal erythema posterior pharynx. No exudate. No evidence for any lip or tongue edema. Doubt burning mouth syndrome. No specific risk factors for B12 deficiency other than age. We decided on trial of Magic mouthwash (Benadryl, nystatin, Maalox) 2 teaspoons swish gargle and spit 4 times daily as needed. Touch base in 1-2 weeks if not improving.  Consider check B12 level if symptoms persist  #2 hypertension stable and at goal  #3 BPH. Symptomatically stable on Avodart. Refill medication for one year

## 2015-05-06 NOTE — Progress Notes (Signed)
Pre visit review using our clinic review tool, if applicable. No additional management support is needed unless otherwise documented below in the visit note. 

## 2015-05-06 NOTE — Patient Instructions (Signed)
Use Magic mouthwash as instructed 4 times daily as needed. Touch base if symptoms not improving over the next 1-2 weeks.

## 2015-05-18 DIAGNOSIS — H353132 Nonexudative age-related macular degeneration, bilateral, intermediate dry stage: Secondary | ICD-10-CM | POA: Diagnosis not present

## 2015-05-18 DIAGNOSIS — D3131 Benign neoplasm of right choroid: Secondary | ICD-10-CM | POA: Diagnosis not present

## 2015-05-18 DIAGNOSIS — Z961 Presence of intraocular lens: Secondary | ICD-10-CM | POA: Diagnosis not present

## 2015-06-01 ENCOUNTER — Other Ambulatory Visit: Payer: Self-pay | Admitting: Family Medicine

## 2015-06-09 DIAGNOSIS — R69 Illness, unspecified: Secondary | ICD-10-CM | POA: Diagnosis not present

## 2015-06-21 DIAGNOSIS — E785 Hyperlipidemia, unspecified: Secondary | ICD-10-CM | POA: Diagnosis not present

## 2015-06-21 DIAGNOSIS — I10 Essential (primary) hypertension: Secondary | ICD-10-CM | POA: Diagnosis not present

## 2015-10-22 ENCOUNTER — Encounter: Payer: Self-pay | Admitting: Family Medicine

## 2015-10-22 ENCOUNTER — Ambulatory Visit (INDEPENDENT_AMBULATORY_CARE_PROVIDER_SITE_OTHER): Payer: Medicare HMO | Admitting: Family Medicine

## 2015-10-22 VITALS — BP 92/60 | HR 89 | Temp 99.0°F | Ht 64.0 in | Wt 134.1 lb

## 2015-10-22 DIAGNOSIS — L820 Inflamed seborrheic keratosis: Secondary | ICD-10-CM | POA: Diagnosis not present

## 2015-10-22 NOTE — Progress Notes (Signed)
Subjective:     Patient ID: Chase Hurst, male   DOB: Jul 07, 1926, 82 y.o.   MRN: [id number]  HPI   Patient here to have skin lesion evaluated behind his left ear. Recently irritated -he thinks he may have scratched it. He has history of multiple seborrheic keratoses.  Past Medical History:  Diagnosis Date  . HYPERLIPIDEMIA 02/25/2009  . Hypertension   . HYPERTROPHY PROSTATE W/UR OBST \\T \ OTH LUTS 02/25/2009   Past Surgical History:  Procedure Laterality Date  . HERNIA REPAIR  1935  . LESION REMOVAL Bilateral 08/06/2012   Procedure: EXCISION LEFT LIP AND RIGHT EYE LESION;  Surgeon: Rozetta Nunnery, MD;  Location: [place];  Service: ENT;  Laterality: Bilateral;    reports that he has never smoked. He does not have any smokeless tobacco history on file. His alcohol and drug histories are not on file. family history is not on file. No Known Allergies    Review of Systems  Constitutional: Negative for appetite change and unexpected weight change.  Hematological: Negative for adenopathy.       Objective:   Physical Exam  Constitutional: He appears well-developed and well-nourished.  Cardiovascular: Normal rate and regular rhythm.   Pulmonary/Chest: Effort normal and breath sounds normal. No respiratory distress. He has no wheezes. He has no rales.  Skin:  Scaly brownish colored well-demarcated lesion behind the left ear. Slightly excoriated surface       Assessment:     Irritated seborrheic keratoses    Plan:     -Reassurance. We have not recommended any intervention at this time. Follow-up for any changes or other concerns.  Could treat with liquid nitrogen if this bothers him.  [name] MD [facility] at [facility]

## 2015-10-22 NOTE — Patient Instructions (Signed)
Seborrheic Keratosis Seborrheic keratosis is a common, noncancerous (benign) skin growth. This condition causes waxy, rough, tan, brown, or black spots to appear on the skin. These skin growths can be flat or raised. CAUSES The cause of this condition is not known. RISK FACTORS This condition is more likely to develop in:  People who have a family history of seborrheic keratosis.  People who are 50 or older.  People who are pregnant.  People who have had estrogen replacement therapy. SYMPTOMS This condition often occurs on the face, chest, shoulders, back, or other areas. These growths:  Are usually painless, but may become irritated and itchy.  Can be yellow, brown, black, or other colors.  Are slightly raised or have a flat surface.  Are sometimes rough or wart-like in texture.  Are often waxy on the surface.  Are round or oval-shaped.  Sometimes look like they are "stuck on."  Often occur in groups, but may occur as a single growth. DIAGNOSIS This condition is diagnosed with a medical history and physical exam. A sample of the growth may be tested (skin biopsy). You may need to see a skin specialist (dermatologist). TREATMENT Treatment is not usually needed for this condition, unless the growths are irritated or are often bleeding. You may also choose to have the growths removed if you do not like their appearance. Most commonly, these growths are treated with a procedure in which liquid nitrogen is applied to "freeze" off the growth (cryosurgery). They may also be burned off with electricity or cut off. HOME CARE INSTRUCTIONS  Watch your growth for any changes.  Keep all follow-up visits as told by your health care provider. This is important.  Do not scratch or pick at the growth or growths. This can cause them to become irritated or infected. SEEK MEDICAL CARE IF:  You suddenly have many new growths.  Your growth bleeds, itches, or hurts.  Your growth suddenly  becomes larger or changes color.   This information is not intended to replace advice given to you by your health care provider. Make sure you discuss any questions you have with your health care provider.   Document Released: 03/25/2010 Document Revised: 11/11/2014 Document Reviewed: 07/08/2014 Elsevier Interactive Patient Education 2016 [employer]  

## 2015-10-22 NOTE — Progress Notes (Signed)
Pre visit review using our clinic review tool, if applicable. No additional management support is needed unless otherwise documented below in the visit note. 

## 2015-11-15 DIAGNOSIS — D3132 Benign neoplasm of left choroid: Secondary | ICD-10-CM | POA: Diagnosis not present

## 2015-11-15 DIAGNOSIS — H353132 Nonexudative age-related macular degeneration, bilateral, intermediate dry stage: Secondary | ICD-10-CM | POA: Diagnosis not present

## 2015-11-15 DIAGNOSIS — D3131 Benign neoplasm of right choroid: Secondary | ICD-10-CM | POA: Diagnosis not present

## 2015-11-15 DIAGNOSIS — Z961 Presence of intraocular lens: Secondary | ICD-10-CM | POA: Diagnosis not present

## 2015-12-06 ENCOUNTER — Encounter: Payer: Self-pay | Admitting: Family Medicine

## 2015-12-06 ENCOUNTER — Ambulatory Visit (INDEPENDENT_AMBULATORY_CARE_PROVIDER_SITE_OTHER): Payer: Medicare HMO | Admitting: Family Medicine

## 2015-12-06 ENCOUNTER — Telehealth: Payer: Self-pay | Admitting: Family Medicine

## 2015-12-06 VITALS — BP 128/80 | HR 100 | Temp 97.8°F | Ht 64.0 in | Wt 128.4 lb

## 2015-12-06 DIAGNOSIS — N400 Enlarged prostate without lower urinary tract symptoms: Secondary | ICD-10-CM | POA: Diagnosis not present

## 2015-12-06 DIAGNOSIS — I10 Essential (primary) hypertension: Secondary | ICD-10-CM

## 2015-12-06 DIAGNOSIS — Z23 Encounter for immunization: Secondary | ICD-10-CM | POA: Diagnosis not present

## 2015-12-06 DIAGNOSIS — E784 Other hyperlipidemia: Secondary | ICD-10-CM | POA: Diagnosis not present

## 2015-12-06 DIAGNOSIS — E7849 Other hyperlipidemia: Secondary | ICD-10-CM

## 2015-12-06 DIAGNOSIS — Z0000 Encounter for general adult medical examination without abnormal findings: Secondary | ICD-10-CM

## 2015-12-06 LAB — CBC WITH DIFFERENTIAL/PLATELET
Basophils Absolute: 0.1 10*3/uL (ref 0.0–0.1)
Basophils Relative: 0.5 % (ref 0.0–3.0)
EOS ABS: 0.2 10*3/uL (ref 0.0–0.7)
EOS PCT: 1.2 % (ref 0.0–5.0)
HCT: 42.6 % (ref 39.0–52.0)
HEMOGLOBIN: 14.6 g/dL (ref 13.0–17.0)
LYMPHS PCT: 20.7 % (ref 12.0–46.0)
Lymphs Abs: 2.6 10*3/uL (ref 0.7–4.0)
MCHC: 34.2 g/dL (ref 30.0–36.0)
MCV: 96.5 fl (ref 78.0–100.0)
MONO ABS: 1.1 10*3/uL — AB (ref 0.1–1.0)
Monocytes Relative: 8.4 % (ref 3.0–12.0)
Neutro Abs: 8.7 10*3/uL — ABNORMAL HIGH (ref 1.4–7.7)
Neutrophils Relative %: 69.2 % (ref 43.0–77.0)
Platelets: 221 10*3/uL (ref 150.0–400.0)
RBC: 4.42 Mil/uL (ref 4.22–5.81)
RDW: 12.9 % (ref 11.5–15.5)
WBC: 12.6 10*3/uL — AB (ref 4.0–10.5)

## 2015-12-06 LAB — LIPID PANEL
Cholesterol: 213 mg/dL — ABNORMAL HIGH (ref 0–200)
HDL: 95.6 mg/dL (ref >39.00)
LDL CALC: 103 mg/dL — AB (ref 0–99)
NONHDL: 117.54
Total CHOL/HDL Ratio: 2
Triglycerides: 72 mg/dL (ref 0.0–149.0)
VLDL: 14.4 mg/dL (ref 0.0–40.0)

## 2015-12-06 LAB — HEPATIC FUNCTION PANEL
ALK PHOS: 49 U/L (ref 39–117)
ALT: 23 U/L (ref 0–53)
AST: 33 U/L (ref 0–37)
Albumin: 4.3 g/dL (ref 3.5–5.2)
BILIRUBIN TOTAL: 1 mg/dL (ref 0.2–1.2)
Bilirubin, Direct: 0.3 mg/dL (ref 0.0–0.3)
Total Protein: 7.1 g/dL (ref 6.0–8.3)

## 2015-12-06 LAB — BASIC METABOLIC PANEL
BUN: 11 mg/dL (ref 6–23)
CHLORIDE: 93 meq/L — AB (ref 96–112)
CO2: 31 meq/L (ref 19–32)
CREATININE: 0.88 mg/dL (ref 0.40–1.50)
Calcium: 10 mg/dL (ref 8.4–10.5)
GFR: 87.04 mL/min (ref >60.00)
GLUCOSE: 79 mg/dL (ref 70–99)
Potassium: 3.6 mEq/L (ref 3.5–5.1)
Sodium: 135 mEq/L (ref 135–145)

## 2015-12-06 LAB — TSH: TSH: 2.82 u[IU]/mL (ref 0.35–4.50)

## 2015-12-06 MED ORDER — SIMVASTATIN 20 MG PO TABS: 20.0000 mg | ORAL_TABLET | Freq: Every day | ORAL | 3 refills | Status: DC

## 2015-12-06 MED ORDER — LISINOPRIL-HYDROCHLOROTHIAZIDE 10-12.5 MG PO TABS: 1.0000 | ORAL_TABLET | Freq: Every day | ORAL | 3 refills | Status: DC

## 2015-12-06 MED ORDER — DUTASTERIDE 0.5 MG PO CAPS: ORAL_CAPSULE | ORAL | 3 refills | Status: DC

## 2015-12-06 NOTE — Addendum Note (Signed)
Addended by: Elio Forget on: 12/06/2015 10:14 AM   Modules accepted: Orders

## 2015-12-06 NOTE — Progress Notes (Signed)
Subjective:     Patient ID: Chase Hurst, male   DOB: May 06, 1926, 82 y.o.   MRN: [id number]  HPI Patient here for physical exam. He has chronic problems include history of hypertension, hyperlipidemia, BPH. Medications reviewed. Compliant with all. Requesting refills of all medications. Still has occasional nocturia but no major obstructive symptoms. Needs flu vaccine. Other immunizations up-to-date. No recent falls but feels slightly more off balance as he has gotten older. Still stays very active.  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 Also very busy taking care of his wife who is very debilitated  Past Medical History:  Diagnosis Date  . HYPERLIPIDEMIA 02/25/2009  . Hypertension   . HYPERTROPHY PROSTATE W/UR OBST \\T \ OTH LUTS 02/25/2009   Past Surgical History:  Procedure Laterality Date  . HERNIA REPAIR  1935  . LESION REMOVAL Bilateral 08/06/2012   Procedure: EXCISION LEFT LIP AND RIGHT EYE LESION;  Surgeon: Rozetta Nunnery, MD;  Location: [place];  Service: ENT;  Laterality: Bilateral;    reports that he has never smoked. He has never used smokeless tobacco. He reports that he drinks alcohol. He reports that he does not use drugs. family history includes Aneurysm in his father. No Known Allergies  Review of Systems  Constitutional: Negative for activity change, appetite change, fatigue and fever.  HENT: Negative for congestion, ear pain and trouble swallowing.   Eyes: Negative for pain and visual disturbance.  Respiratory: Negative for cough, shortness of breath and wheezing.   Cardiovascular: Negative for chest pain and palpitations.  Gastrointestinal: Negative for abdominal distention, abdominal pain, blood in stool, constipation, diarrhea, nausea, rectal pain and vomiting.  Genitourinary: Negative for dysuria, hematuria and testicular pain.  Musculoskeletal: Negative for arthralgias and joint swelling.  Skin: Negative for rash.   Neurological: Negative for dizziness, syncope and headaches.  Hematological: Negative for adenopathy.  Psychiatric/Behavioral: Negative for confusion and dysphoric mood.       Objective:   Physical Exam  Constitutional: He is oriented to person, place, and time. He appears well-developed and well-nourished. No distress.  HENT:  Head: Normocephalic and atraumatic.  Right Ear: External ear normal.  Left Ear: External ear normal.  Mouth/Throat: Oropharynx is clear and moist.  Eyes: Conjunctivae and EOM are normal. Pupils are equal, round, and reactive to light.  Neck: Normal range of motion. Neck supple. No thyromegaly present.  Cardiovascular: Normal rate, regular rhythm and normal heart sounds.   No murmur heard. Pulmonary/Chest: No respiratory distress. He has no wheezes. He has no rales.  Abdominal: Soft. Bowel sounds are normal. He exhibits no distension and no mass. There is no tenderness. There is no rebound and no guarding.  Musculoskeletal: He exhibits no edema.  Lymphadenopathy:    He has no cervical adenopathy.  Neurological: He is alert and oriented to person, place, and time. He displays normal reflexes. No cranial nerve deficit.  Skin: No rash noted.  Patient has multiple scattered seborrheic keratoses on his face, trunk, and extremities  Psychiatric: He has a normal mood and affect.       Assessment:     Physical exam. Patient needs flu vaccine. Multiple medical problems as above stable    Plan:     -Flu vaccine given -Obtain screening lab work -Refills of regular medications given for one year -We discussed the importance of continued activity and exercise in reducing fall risk  [name] MD [facility]

## 2015-12-06 NOTE — Telephone Encounter (Signed)
Pt would like blood work results °

## 2015-12-06 NOTE — Progress Notes (Signed)
Pre visit review using our clinic review tool, if applicable. No additional management support is needed unless otherwise documented below in the visit note. 

## 2015-12-07 NOTE — Telephone Encounter (Signed)
Please see lab result annotations.

## 2015-12-15 DIAGNOSIS — R69 Illness, unspecified: Secondary | ICD-10-CM | POA: Diagnosis not present

## 2016-03-21 DIAGNOSIS — N401 Enlarged prostate with lower urinary tract symptoms: Secondary | ICD-10-CM | POA: Diagnosis not present

## 2016-03-21 DIAGNOSIS — I10 Essential (primary) hypertension: Secondary | ICD-10-CM | POA: Diagnosis not present

## 2016-03-21 DIAGNOSIS — Z6822 Body mass index (BMI) 22.0-22.9, adult: Secondary | ICD-10-CM | POA: Diagnosis not present

## 2016-03-21 DIAGNOSIS — Z79899 Other long term (current) drug therapy: Secondary | ICD-10-CM | POA: Diagnosis not present

## 2016-03-21 DIAGNOSIS — E785 Hyperlipidemia, unspecified: Secondary | ICD-10-CM | POA: Diagnosis not present

## 2016-03-21 DIAGNOSIS — R011 Cardiac murmur, unspecified: Secondary | ICD-10-CM | POA: Diagnosis not present

## 2016-03-21 DIAGNOSIS — Z0000 Encounter for general adult medical examination without abnormal findings: Secondary | ICD-10-CM | POA: Diagnosis not present

## 2016-05-16 DIAGNOSIS — H353132 Nonexudative age-related macular degeneration, bilateral, intermediate dry stage: Secondary | ICD-10-CM | POA: Diagnosis not present

## 2016-05-16 DIAGNOSIS — D3132 Benign neoplasm of left choroid: Secondary | ICD-10-CM | POA: Diagnosis not present

## 2016-05-16 DIAGNOSIS — D3131 Benign neoplasm of right choroid: Secondary | ICD-10-CM | POA: Diagnosis not present

## 2016-07-06 DIAGNOSIS — R69 Illness, unspecified: Secondary | ICD-10-CM | POA: Diagnosis not present

## 2016-07-20 ENCOUNTER — Other Ambulatory Visit: Payer: Self-pay | Admitting: Family Medicine

## 2016-08-31 ENCOUNTER — Telehealth: Payer: Self-pay

## 2016-08-31 NOTE — Telephone Encounter (Signed)
Patient is on the list for [employer] 2018 and may be a good candidate for an AWV. Please let me know if/when appt is scheduled.   

## 2016-11-02 DIAGNOSIS — R69 Illness, unspecified: Secondary | ICD-10-CM | POA: Diagnosis not present

## 2016-11-16 DIAGNOSIS — Z961 Presence of intraocular lens: Secondary | ICD-10-CM | POA: Diagnosis not present

## 2016-11-16 DIAGNOSIS — H353132 Nonexudative age-related macular degeneration, bilateral, intermediate dry stage: Secondary | ICD-10-CM | POA: Diagnosis not present

## 2016-11-16 DIAGNOSIS — D3131 Benign neoplasm of right choroid: Secondary | ICD-10-CM | POA: Diagnosis not present

## 2017-01-10 ENCOUNTER — Ambulatory Visit (INDEPENDENT_AMBULATORY_CARE_PROVIDER_SITE_OTHER): Payer: Medicare HMO | Admitting: Family Medicine

## 2017-01-10 ENCOUNTER — Encounter: Payer: Self-pay | Admitting: Family Medicine

## 2017-01-10 VITALS — BP 140/80 | HR 76 | Temp 97.9°F | Ht 64.5 in | Wt 131.5 lb

## 2017-01-10 DIAGNOSIS — E7849 Other hyperlipidemia: Secondary | ICD-10-CM

## 2017-01-10 DIAGNOSIS — I10 Essential (primary) hypertension: Secondary | ICD-10-CM | POA: Diagnosis not present

## 2017-01-10 DIAGNOSIS — Z23 Encounter for immunization: Secondary | ICD-10-CM | POA: Diagnosis not present

## 2017-01-10 DIAGNOSIS — Z0000 Encounter for general adult medical examination without abnormal findings: Secondary | ICD-10-CM

## 2017-01-10 LAB — LIPID PANEL
CHOL/HDL RATIO: 2
Cholesterol: 212 mg/dL — ABNORMAL HIGH (ref 0–200)
HDL: 93.2 mg/dL (ref >39.00)
LDL CALC: 100 mg/dL — AB (ref 0–99)
NONHDL: 118.62
TRIGLYCERIDES: 93 mg/dL (ref 0.0–149.0)
VLDL: 18.6 mg/dL (ref 0.0–40.0)

## 2017-01-10 LAB — BASIC METABOLIC PANEL
BUN: 9 mg/dL (ref 6–23)
CALCIUM: 9.9 mg/dL (ref 8.4–10.5)
CHLORIDE: 97 meq/L (ref 96–112)
CO2: 31 meq/L (ref 19–32)
CREATININE: 0.9 mg/dL (ref 0.40–1.50)
GFR: 84.59 mL/min (ref >60.00)
Glucose, Bld: 79 mg/dL (ref 70–99)
Potassium: 3.8 mEq/L (ref 3.5–5.1)
Sodium: 139 mEq/L (ref 135–145)

## 2017-01-10 LAB — HEPATIC FUNCTION PANEL
ALK PHOS: 47 U/L (ref 39–117)
ALT: 17 U/L (ref 0–53)
AST: 27 U/L (ref 0–37)
Albumin: 4.3 g/dL (ref 3.5–5.2)
BILIRUBIN DIRECT: 0.2 mg/dL (ref 0.0–0.3)
BILIRUBIN TOTAL: 0.8 mg/dL (ref 0.2–1.2)
TOTAL PROTEIN: 7 g/dL (ref 6.0–8.3)

## 2017-01-10 NOTE — Progress Notes (Signed)
Subjective:     Patient ID: Chase Hurst, male   DOB: 1926/10/06, 83 y.o.   MRN: [id number]  HPI Pt here for physical exam.  Has hx of BPH, hypertension, and hyperlipidemia.  Medications reviewed and compliant with all.  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   No recent falls and denies any depression issues.  Poor appetite, which is not new, but no weight changes.  Needs flu vaccine.  Other immunizations up to date.  Past Medical History:  Diagnosis Date  . HYPERLIPIDEMIA 02/25/2009  . Hypertension   . HYPERTROPHY PROSTATE W/UR OBST \\T \ OTH LUTS 02/25/2009   Past Surgical History:  Procedure Laterality Date  . HERNIA REPAIR  1935    reports that  has never smoked. he has never used smokeless tobacco. He reports that he drinks alcohol. He reports that he does not use drugs. family history includes Aneurysm in his father. No Known Allergies   Review of Systems  Constitutional: Negative for activity change, appetite change, fatigue and fever.  HENT: Negative for congestion, ear pain and trouble swallowing.   Eyes: Negative for pain and visual disturbance.  Respiratory: Negative for cough, shortness of breath and wheezing.   Cardiovascular: Negative for chest pain and palpitations.  Gastrointestinal: Negative for abdominal distention, abdominal pain, blood in stool, constipation, diarrhea, nausea, rectal pain and vomiting.  Genitourinary: Negative for dysuria, hematuria and testicular pain.  Musculoskeletal: Negative for arthralgias and joint swelling.  Skin: Negative for rash.  Neurological: Negative for dizziness, syncope and headaches.  Hematological: Negative for adenopathy.  Psychiatric/Behavioral: Negative for confusion and dysphoric mood.       Objective:   Physical Exam  Constitutional: He is oriented to person, place, and time. He appears well-developed and well-nourished. No distress.  HENT:  Head: Normocephalic and atraumatic.  Right  Ear: External ear normal.  Left Ear: External ear normal.  Mouth/Throat: Oropharynx is clear and moist.  Eyes: Conjunctivae and EOM are normal. Pupils are equal, round, and reactive to light.  Neck: Normal range of motion. Neck supple. No thyromegaly present.  Cardiovascular: Normal rate, regular rhythm and normal heart sounds.  No murmur heard. Pulmonary/Chest: No respiratory distress. He has no wheezes. He has no rales.  Abdominal: Soft. Bowel sounds are normal. He exhibits no distension and no mass. There is no tenderness. There is no rebound and no guarding.  Musculoskeletal: He exhibits no edema.  Lymphadenopathy:    He has no cervical adenopathy.  Neurological: He is alert and oriented to person, place, and time. He displays normal reflexes. No cranial nerve deficit.  Skin: No rash noted.  Multiple seborrheic keratoses.  Psychiatric: He has a normal mood and affect.       Assessment:     Physical exam.  Several issues addressed as below.    Plan:     -flu vaccine given -will need tetanus by next year. -check labs with BMP, lipid, and hepatic  -continue to stay active. -routine follow up in one year and sooner prn.  [name] MD [facility] at [facility]

## 2017-01-10 NOTE — Patient Instructions (Signed)
Monitor blood pressure at home and be in touch if consistently > 140/90. We will call you with lab results.

## 2017-01-24 ENCOUNTER — Other Ambulatory Visit: Payer: Self-pay | Admitting: *Deleted

## 2017-01-24 MED ORDER — SIMVASTATIN 20 MG PO TABS: 20.0000 mg | ORAL_TABLET | Freq: Every day | ORAL | 3 refills | Status: DC

## 2017-02-18 ENCOUNTER — Other Ambulatory Visit: Payer: Self-pay | Admitting: Family Medicine

## 2017-03-20 DIAGNOSIS — N529 Male erectile dysfunction, unspecified: Secondary | ICD-10-CM | POA: Diagnosis not present

## 2017-03-20 DIAGNOSIS — R32 Unspecified urinary incontinence: Secondary | ICD-10-CM | POA: Diagnosis not present

## 2017-03-20 DIAGNOSIS — E785 Hyperlipidemia, unspecified: Secondary | ICD-10-CM | POA: Diagnosis not present

## 2017-03-20 DIAGNOSIS — Z8249 Family history of ischemic heart disease and other diseases of the circulatory system: Secondary | ICD-10-CM | POA: Diagnosis not present

## 2017-03-20 DIAGNOSIS — I10 Essential (primary) hypertension: Secondary | ICD-10-CM | POA: Diagnosis not present

## 2017-03-20 DIAGNOSIS — N400 Enlarged prostate without lower urinary tract symptoms: Secondary | ICD-10-CM | POA: Diagnosis not present

## 2017-03-30 ENCOUNTER — Other Ambulatory Visit: Payer: Self-pay | Admitting: Family Medicine

## 2017-05-22 DIAGNOSIS — H353132 Nonexudative age-related macular degeneration, bilateral, intermediate dry stage: Secondary | ICD-10-CM | POA: Diagnosis not present

## 2017-05-22 DIAGNOSIS — D3132 Benign neoplasm of left choroid: Secondary | ICD-10-CM | POA: Diagnosis not present

## 2017-05-22 DIAGNOSIS — H524 Presbyopia: Secondary | ICD-10-CM | POA: Diagnosis not present

## 2017-05-22 DIAGNOSIS — Z961 Presence of intraocular lens: Secondary | ICD-10-CM | POA: Diagnosis not present

## 2017-05-22 DIAGNOSIS — H52223 Regular astigmatism, bilateral: Secondary | ICD-10-CM | POA: Diagnosis not present

## 2017-06-13 ENCOUNTER — Telehealth: Payer: Self-pay | Admitting: Family Medicine

## 2017-06-13 NOTE — Telephone Encounter (Signed)
Copied from [place] [id number]. Topic: Quick Communication - See Telephone Encounter >> Jun 13, 2017  1:49 PM Ivar Drape wrote: CRM for notification. See Telephone encounter for: 06/13/17. Patient needs a prescription for a stair lift because he can't get up the stairs anymore.

## 2017-06-15 NOTE — Telephone Encounter (Signed)
Okay for prescription for lift chair. 

## 2017-06-17 NOTE — Telephone Encounter (Signed)
Clarify.  Looks like his request was for "stair lift" vs chair lift.  OK to put in order for stair lift- if that is what he needs.

## 2017-06-18 ENCOUNTER — Ambulatory Visit: Payer: Self-pay

## 2017-06-18 NOTE — Telephone Encounter (Signed)
Pt. Reports that over the last month he has noticed increased shortness of breath with exertion - walking up stairs, carrying things. Denies any chest pain. Appointment scheduled. Offered an appointment sooner with a different provider - pt. Refuses. Instructed if feels worse to call back or go to ED. Verbalizes understanding. Reason for Disposition . [1] MODERATE longstanding difficulty breathing (e.g., speaks in phrases, SOB even at rest, pulse 100-120) AND [2] SAME as normal  Answer Assessment - Initial Assessment Questions 1. RESPIRATORY STATUS: "Describe your breathing?" (e.g., wheezing, shortness of breath, unable to speak, severe coughing)      Short of breath with exertion 2. ONSET: "When did this breathing problem begin?"      1 MONTH ago 3. PATTERN "Does the difficult breathing come and go, or has it been constant since it started?"      With exertion 4. SEVERITY: "How bad is your breathing?" (e.g., mild, moderate, severe)    - MILD: No SOB at rest, mild SOB with walking, speaks normally in sentences, can lay down, no retractions, pulse < 100.    - MODERATE: SOB at rest, SOB with minimal exertion and prefers to sit, cannot lie down flat, speaks in phrases, mild retractions, audible wheezing, pulse 100-120.    - SEVERE: Very SOB at rest, speaks in single words, struggling to breathe, sitting hunched forward, retractions, pulse > 120      Mild 5. RECURRENT SYMPTOM: "Have you had difficulty breathing before?" If so, ask: "When was the last time?" and "What happened that time?"      No 6. CARDIAC HISTORY: "Do you have any history of heart disease?" (e.g., heart attack, angina, bypass surgery, angioplasty)      HTN 7. LUNG HISTORY: "Do you have any history of lung disease?"  (e.g., pulmonary embolus, asthma, emphysema)     No 8. CAUSE: "What do you think is causing the breathing problem?"      Unsure 9. OTHER SYMPTOMS: "Do you have any other symptoms? (e.g., dizziness, runny nose, cough,  chest pain, fever)     No 10. PREGNANCY: "Is there any chance you are pregnant?" "When was your last menstrual period?"       No 11. TRAVEL: "Have you traveled out of the country in the last month?" (e.g., travel history, exposures)       No  Protocols used: BREATHING DIFFICULTY-A-AH

## 2017-06-18 NOTE — Telephone Encounter (Signed)
For review

## 2017-06-18 NOTE — Telephone Encounter (Signed)
Left message with male to clarify what is needed and where the order/prescription needs to go.  CRM created

## 2017-06-18 NOTE — Telephone Encounter (Signed)
I recommend he be seen in next day or two based on history given.  Especially needs to be seen soon with elevated pulse.  ? A fib

## 2017-06-19 ENCOUNTER — Encounter: Payer: Self-pay | Admitting: Family Medicine

## 2017-06-19 ENCOUNTER — Ambulatory Visit (INDEPENDENT_AMBULATORY_CARE_PROVIDER_SITE_OTHER): Payer: Medicare HMO | Admitting: Family Medicine

## 2017-06-19 VITALS — BP 130/80 | HR 90 | Temp 98.7°F | Wt 127.5 lb

## 2017-06-19 DIAGNOSIS — M6281 Muscle weakness (generalized): Secondary | ICD-10-CM

## 2017-06-19 DIAGNOSIS — R5383 Other fatigue: Secondary | ICD-10-CM | POA: Diagnosis not present

## 2017-06-19 DIAGNOSIS — Z9181 History of falling: Secondary | ICD-10-CM | POA: Diagnosis not present

## 2017-06-19 DIAGNOSIS — R0609 Other forms of dyspnea: Secondary | ICD-10-CM | POA: Diagnosis not present

## 2017-06-19 DIAGNOSIS — R269 Unspecified abnormalities of gait and mobility: Secondary | ICD-10-CM

## 2017-06-19 DIAGNOSIS — I10 Essential (primary) hypertension: Secondary | ICD-10-CM | POA: Diagnosis not present

## 2017-06-19 DIAGNOSIS — R0600 Dyspnea, unspecified: Secondary | ICD-10-CM

## 2017-06-19 LAB — CBC WITH DIFFERENTIAL/PLATELET
BASOS ABS: 0 10*3/uL (ref 0.0–0.1)
Basophils Relative: 0.3 % (ref 0.0–3.0)
EOS ABS: 0.1 10*3/uL (ref 0.0–0.7)
Eosinophils Relative: 0.9 % (ref 0.0–5.0)
HCT: 38.5 % — ABNORMAL LOW (ref 39.0–52.0)
Hemoglobin: 13.3 g/dL (ref 13.0–17.0)
LYMPHS ABS: 1.8 10*3/uL (ref 0.7–4.0)
Lymphocytes Relative: 17.2 % (ref 12.0–46.0)
MCHC: 34.6 g/dL (ref 30.0–36.0)
MCV: 98.9 fl (ref 78.0–100.0)
MONO ABS: 0.9 10*3/uL (ref 0.1–1.0)
MONOS PCT: 8.2 % (ref 3.0–12.0)
NEUTROS ABS: 7.9 10*3/uL — AB (ref 1.4–7.7)
NEUTROS PCT: 73.4 % (ref 43.0–77.0)
PLATELETS: 191 10*3/uL (ref 150.0–400.0)
RBC: 3.89 Mil/uL — AB (ref 4.22–5.81)
RDW: 12.9 % (ref 11.5–15.5)
WBC: 10.7 10*3/uL — ABNORMAL HIGH (ref 4.0–10.5)

## 2017-06-19 LAB — VITAMIN B12: VITAMIN B 12: 441 pg/mL (ref 211–911)

## 2017-06-19 LAB — COMPREHENSIVE METABOLIC PANEL
ALK PHOS: 49 U/L (ref 39–117)
ALT: 17 U/L (ref 0–53)
AST: 28 U/L (ref 0–37)
Albumin: 4.2 g/dL (ref 3.5–5.2)
BILIRUBIN TOTAL: 0.7 mg/dL (ref 0.2–1.2)
BUN: 12 mg/dL (ref 6–23)
CO2: 33 meq/L — AB (ref 19–32)
Calcium: 9.5 mg/dL (ref 8.4–10.5)
Chloride: 94 mEq/L — ABNORMAL LOW (ref 96–112)
Creatinine, Ser: 0.86 mg/dL (ref 0.40–1.50)
GFR: 89.06 mL/min (ref >60.00)
GLUCOSE: 82 mg/dL (ref 70–99)
Potassium: 3.3 mEq/L — ABNORMAL LOW (ref 3.5–5.1)
SODIUM: 135 meq/L (ref 135–145)
TOTAL PROTEIN: 6.6 g/dL (ref 6.0–8.3)

## 2017-06-19 LAB — TSH: TSH: 1.34 u[IU]/mL (ref 0.35–4.50)

## 2017-06-19 LAB — VITAMIN D 25 HYDROXY (VIT D DEFICIENCY, FRACTURES): VITD: 75.01 ng/mL (ref 30.00–100.00)

## 2017-06-19 LAB — BRAIN NATRIURETIC PEPTIDE: Pro B Natriuretic peptide (BNP): 30 pg/mL (ref 0.0–100.0)

## 2017-06-19 NOTE — Telephone Encounter (Signed)
Appointment made

## 2017-06-19 NOTE — Patient Instructions (Signed)
Shortness of Breath, Adult Shortness of breath is when a person has trouble breathing enough air, or when a person feels like she or he is having trouble breathing in enough air. Shortness of breath could be a sign of medical problem. Follow these instructions at home: Pay attention to any changes in your symptoms. Take these actions to help with your condition:  Do not smoke. Smoking is a common cause of shortness of breath. If you smoke and you need help quitting, ask your health care provider.  Avoid things that can irritate your airways, such as: ? Mold. ? Dust. ? Air pollution. ? Chemical fumes. ? Things that can cause allergy symptoms (allergens), if you have allergies.  Keep your living space clean and free of mold and dust.  Rest as needed. Slowly return to your usual activities.  Take over-the-counter and prescription medicines, including oxygen and inhaled medicines, only as told by your health care provider.  Keep all follow-up visits as told by your health care provider. This is important.  Contact a health care provider if:  Your condition does not improve as soon as expected.  You have a hard time doing your normal activities, even after you rest.  You have new symptoms. Get help right away if:  Your shortness of breath gets worse.  You have shortness of breath when you are resting.  You feel light-headed or you faint.  You have a cough that is not controlled with medicines.  You cough up blood.  You have pain with breathing.  You have pain in your chest, arms, shoulders, or abdomen.  You have a fever.  You cannot walk up stairs or exercise the way that you normally do. This information is not intended to replace advice given to you by your health care provider. Make sure you discuss any questions you have with your health care provider. Document Released: 11/15/2000 Document Revised: 09/11/2015 Document Reviewed: 07/29/2015 Elsevier Interactive Patient  Education  2018 Elsevier Inc.  

## 2017-06-19 NOTE — Progress Notes (Signed)
Subjective:     Patient ID: Chase Hurst, male   DOB: 05/23/1926, 84 y.o.   MRN: [id number]  HPI Patient is an 84 year old male with history of hypertension, BPH, hyperlipidemia.  He had called earlier stating that he was having some occasional dizziness and dyspnea with exertion for the past several weeks. No chest pain. He has noted increased fatigue and dyspnea with things like climbing stairs. He has no history of cardiac problems. Rare orthopnea symptoms. No peripheral edema.  He states he has also recently felt more "off-balance "with walking. He's had some decreased muscle strength in his quadriceps muscles. Is not aware of any palpitations or arrhythmia.  Current medications include simvastatin, lisinopril HCTZ, and Avodart.  Past Medical History:  Diagnosis Date  . HYPERLIPIDEMIA 02/25/2009  . Hypertension   . HYPERTROPHY PROSTATE W/UR OBST \\T \ OTH LUTS 02/25/2009   Past Surgical History:  Procedure Laterality Date  . HERNIA REPAIR  1935  . LESION REMOVAL Bilateral 08/06/2012   Procedure: EXCISION LEFT LIP AND RIGHT EYE LESION;  Surgeon: Rozetta Nunnery, MD;  Location: [place];  Service: ENT;  Laterality: Bilateral;    reports that he has never smoked. He has never used smokeless tobacco. He reports that he drinks alcohol. He reports that he does not use drugs. family history includes Aneurysm in his father. No Known Allergies   Review of Systems  Constitutional: Negative for chills and fever.  Respiratory: Positive for shortness of breath. Negative for cough, chest tightness and wheezing.   Cardiovascular: Negative for chest pain, palpitations and leg swelling.  Gastrointestinal: Negative for abdominal pain, diarrhea, nausea and vomiting.  Genitourinary: Negative for dysuria.  Neurological: Positive for dizziness and light-headedness. Negative for seizures and syncope.  Hematological: Negative for adenopathy.  Psychiatric/Behavioral: Negative for  confusion.       Objective:   Physical Exam  Constitutional: He is oriented to person, place, and time. He appears well-developed and well-nourished.  Neck: Neck supple. No thyromegaly present.  Cardiovascular: Normal rate.  Pulmonary/Chest: Effort normal and breath sounds normal. No respiratory distress. He has no wheezes. He has no rales.  Musculoskeletal: He exhibits no edema.  Neurological: He is alert and oriented to person, place, and time. No cranial nerve deficit.  No focal weakness       Assessment:     #1 several week history of reported dyspnea on exertion. No associated chest pain or tightness. No known cardiac history. He has history of hypertension which has been fairly well controlled.  No overt evidence for heart failure.  #2 nonspecific malaise and fatigue progressive over several weeks  #3 hypertension stable and at goal  #4 history of hyperlipidemia    Plan:     -Check EKG=occ PVC, no acute change.   -Check further labs with CBC, comprehensive metabolic panel, TSH, BNP level, 25 hydroxy vitamin D, B12 -Consider echocardiogram to further evaluate -reassess in 2 weeks.  Consider CXR at follow up.  [name] MD [facility] at [facility]

## 2017-06-25 ENCOUNTER — Other Ambulatory Visit: Payer: Self-pay

## 2017-06-25 ENCOUNTER — Ambulatory Visit (HOSPITAL_COMMUNITY): Payer: Medicare HMO | Attending: Internal Medicine

## 2017-06-25 DIAGNOSIS — E785 Hyperlipidemia, unspecified: Secondary | ICD-10-CM | POA: Diagnosis not present

## 2017-06-25 DIAGNOSIS — I350 Nonrheumatic aortic (valve) stenosis: Secondary | ICD-10-CM | POA: Diagnosis not present

## 2017-06-25 DIAGNOSIS — R0609 Other forms of dyspnea: Secondary | ICD-10-CM

## 2017-06-25 DIAGNOSIS — R0600 Dyspnea, unspecified: Secondary | ICD-10-CM

## 2017-06-25 DIAGNOSIS — I119 Hypertensive heart disease without heart failure: Secondary | ICD-10-CM | POA: Insufficient documentation

## 2017-06-26 ENCOUNTER — Other Ambulatory Visit: Payer: Self-pay

## 2017-06-26 ENCOUNTER — Ambulatory Visit (INDEPENDENT_AMBULATORY_CARE_PROVIDER_SITE_OTHER): Payer: Medicare HMO | Admitting: Family Medicine

## 2017-06-26 ENCOUNTER — Encounter: Payer: Self-pay | Admitting: Family Medicine

## 2017-06-26 VITALS — BP 114/64 | HR 99 | Temp 98.2°F | Resp 16 | Ht 63.5 in | Wt 127.2 lb

## 2017-06-26 DIAGNOSIS — I350 Nonrheumatic aortic (valve) stenosis: Secondary | ICD-10-CM | POA: Diagnosis not present

## 2017-06-26 DIAGNOSIS — R29898 Other symptoms and signs involving the musculoskeletal system: Secondary | ICD-10-CM

## 2017-06-26 DIAGNOSIS — E876 Hypokalemia: Secondary | ICD-10-CM | POA: Diagnosis not present

## 2017-06-26 DIAGNOSIS — R0600 Dyspnea, unspecified: Secondary | ICD-10-CM

## 2017-06-26 NOTE — Progress Notes (Signed)
Subjective:     Patient ID: Chase Hurst, male   DOB: 01/07/1927, 84 y.o.   MRN: [id number]  HPI Patient seen for follow-up regarding recent increased dyspnea with exertion and weakness with navigating stairs. Patient states he may have "overemphasized "the dyspnea. He states his major issue is increasing lower extremity weakness. Has never had chest pain.  We obtained several labs including BNP level, B12 level, vitamin D level, TSH, chemistries, CBC these were all basically normal with exception of minimal low potassium 3.3.  Patient had echocardiogram yesterday which showed normal ejection fraction. Had some aortic stenosis which was classified as moderate. He thinks his appetite is diminished but his albumin was 4.2. Denies any muscle aches. No low back pain. No urine or stool incontinence. No recent falls.  Stays very active with beekeeping  Past Medical History:  Diagnosis Date  . HYPERLIPIDEMIA 02/25/2009  . Hypertension   . HYPERTROPHY PROSTATE W/UR OBST \\T \ OTH LUTS 02/25/2009   Past Surgical History:  Procedure Laterality Date  . HERNIA REPAIR  1935  . LESION REMOVAL Bilateral 08/06/2012   Procedure: EXCISION LEFT LIP AND RIGHT EYE LESION;  Surgeon: Rozetta Nunnery, MD;  Location: [place];  Service: ENT;  Laterality: Bilateral;    reports that he has never smoked. He has never used smokeless tobacco. He reports that he drinks alcohol. He reports that he does not use drugs. family history includes Aneurysm in his father. No Known Allergies   Review of Systems  Respiratory: Positive for shortness of breath. Negative for cough and wheezing.   Cardiovascular: Negative for chest pain, palpitations and leg swelling.  Gastrointestinal: Negative for abdominal pain.  Genitourinary: Negative for dysuria.  Neurological: Positive for weakness. Negative for dizziness, syncope and numbness.  Hematological: Negative for adenopathy.  Psychiatric/Behavioral:  Negative for confusion.       Objective:   Physical Exam  Constitutional: He is oriented to person, place, and time. He appears well-developed and well-nourished.  Neck: Neck supple. No thyromegaly present.  Cardiovascular: Normal rate and regular rhythm.  Pulmonary/Chest: Effort normal and breath sounds normal. No respiratory distress. He has no wheezes. He has no rales.  Musculoskeletal: He exhibits no edema.  Neurological: He is alert and oriented to person, place, and time. No cranial nerve deficit.  Symmetric reflexes lower extremities. No focal strength deficits       Assessment:     #1 exertional dyspnea. Question related to some deconditioning over the winter possibly component of aortic stenosis contributing.  #2 complaints of lower extremity weakness-generalized.  #3 recent mild hypokalemia    Plan:     -Recommend potassium rich diet with handout given -Repeat basic metabolic panel at follow-up -We discussed possible referral for physical therapy for lower extremity strengthening exercises and he declines. Like to work on some things on his own at home first -We discussed his dyspnea. At this point he declines further evaluation. He would like to work on gradually increasing his activities first.  Consider cardiology referral for any progression.  [name] MD  Primary Care at [facility]

## 2017-06-26 NOTE — Patient Instructions (Signed)

## 2017-09-11 DIAGNOSIS — R69 Illness, unspecified: Secondary | ICD-10-CM | POA: Diagnosis not present

## 2017-09-21 ENCOUNTER — Other Ambulatory Visit: Payer: Self-pay | Admitting: Family Medicine

## 2017-09-25 ENCOUNTER — Encounter: Payer: Self-pay | Admitting: Family Medicine

## 2017-09-25 ENCOUNTER — Ambulatory Visit (INDEPENDENT_AMBULATORY_CARE_PROVIDER_SITE_OTHER): Payer: Medicare HMO | Admitting: Family Medicine

## 2017-09-25 VITALS — BP 98/54 | HR 102 | Temp 97.6°F | Wt 129.7 lb

## 2017-09-25 DIAGNOSIS — R0609 Other forms of dyspnea: Secondary | ICD-10-CM | POA: Diagnosis not present

## 2017-09-25 DIAGNOSIS — I350 Nonrheumatic aortic (valve) stenosis: Secondary | ICD-10-CM

## 2017-09-25 DIAGNOSIS — E876 Hypokalemia: Secondary | ICD-10-CM | POA: Diagnosis not present

## 2017-09-25 DIAGNOSIS — R0600 Dyspnea, unspecified: Secondary | ICD-10-CM

## 2017-09-25 LAB — BASIC METABOLIC PANEL
BUN: 14 mg/dL (ref 6–23)
CHLORIDE: 92 meq/L — AB (ref 96–112)
CO2: 35 mEq/L — ABNORMAL HIGH (ref 19–32)
CREATININE: 0.95 mg/dL (ref 0.40–1.50)
Calcium: 9.6 mg/dL (ref 8.4–10.5)
GFR: 79.35 mL/min (ref >60.00)
GLUCOSE: 179 mg/dL — AB (ref 70–99)
Potassium: 4.1 mEq/L (ref 3.5–5.1)
Sodium: 134 mEq/L — ABNORMAL LOW (ref 135–145)

## 2017-09-25 NOTE — Patient Instructions (Signed)
I would like for you you to schedule a Medicare Annual Wellness Visit (AWV).   This is a yearly appointment with our Health Coach Wynetta Fines, RN) and is designed to develop a personalized prevention plan. This is not a head to toe physical, but rather an opportunity to prevent illness based on your current health and risk factors for disease.   Visits usually last 30-60 minutes and include various screenings for hearing, vision, depression, and dementia, falls, and safety concerns. The visit also includes diet and exercise counseling and information about advance directives.   This is also an opportunity to discuss appropriate health maintenance testing such as mammography, colonoscopy, lung cancer screening, and hepatitis C testing.   The AWV is fully covered by Medicare Part B if:  . You have had Part B for over 12 months, AND . You have not had an AWV in the past 12 months .  Please don't miss out on this opportunity! Set up your appointment today!  I will set up referral to cardiology regarding your shortness of breath and aortic stenosis.

## 2017-09-25 NOTE — Progress Notes (Signed)
  Subjective:     Patient ID: Chase Hurst, male   DOB: 1926-10-22, 84 y.o.   MRN: [id number]  HPI Patient seen for follow-up from Spring visit. He presented with some generalized weakness and some gradual progressive exertional dyspnea but no chest pains. Lab work was unremarkable other than mild low potassium 3.3. Echocardiogram revealed moderate aortic stenosis. Ejection fraction 55-60%. 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 He has noted recently some exertional dyspnea with things like taking the garbage cans out. He had 2 daughters that were visiting from out of town recently and they had noticed that he was having more dyspnea with exertion as well. No orthopnea. No peripheral edema. Recent BNP level normal. No history of any chronic lung disease. Never smoked.  Patient had recent potassium slightly low 3.3. He is on lisinopril HCTZ. No dizziness. No syncope. He's had some recent gradual decline in appetite but weight is actually up 2 pounds from last visit.  Past Medical History:  Diagnosis Date  . HYPERLIPIDEMIA 02/25/2009  . Hypertension   . HYPERTROPHY PROSTATE W/UR OBST \\T \ OTH LUTS 02/25/2009   Past Surgical History:  Procedure Laterality Date  . HERNIA REPAIR  1935  . LESION REMOVAL Bilateral 08/06/2012   Procedure: EXCISION LEFT LIP AND RIGHT EYE LESION;  Surgeon: Rozetta Nunnery, MD;  Location: [place];  Service: ENT;  Laterality: Bilateral;    reports that he has never smoked. He has never used smokeless tobacco. He reports that he drinks alcohol. He reports that he does not use drugs. family history includes Aneurysm in his father. No Known Allergies   Review of Systems  Constitutional: Negative for fatigue and unexpected weight change.  Eyes: Negative for visual disturbance.  Respiratory: Positive for shortness of breath. Negative for cough, chest tightness and wheezing.   Cardiovascular: Negative for chest pain, palpitations  and leg swelling.  Gastrointestinal: Negative for abdominal pain.  Genitourinary: Negative for dysuria.  Neurological: Negative for dizziness, syncope, weakness, light-headedness and headaches.       Objective:   Physical Exam  Constitutional: He is oriented to person, place, and time. He appears well-developed and well-nourished.  HENT:  Mouth/Throat: Oropharynx is clear and moist.  Eyes: Pupils are equal, round, and reactive to light.  Neck: Neck supple. No thyromegaly present.  Cardiovascular: Normal rate and regular rhythm.  Murmur heard. Pulmonary/Chest: Effort normal and breath sounds normal. No respiratory distress. He has no wheezes. He has no rales.  Musculoskeletal: He exhibits no edema.  Neurological: He is alert and oriented to person, place, and time.       Assessment:     #1 several month history of progressive exertional dyspnea. Moderate aortic stenosis by echocardiogram with EF 55-60%. No history of known CAD.  No evidence to suggest pulmonary etiology. O2 sats 97%. No chronic lung issues.  #2 hypertension stable  #3 recent mild hypokalemia with potassium 3.3    Plan:     -Recheck basic metabolic panel -Patient agrees today to cardiology evaluation regarding his aortic stenosis and exertional dyspnea.  Referral has been made. -Routine follow-up in 6 months consider as needed  [name] MD [facility] at [facility]

## 2017-09-28 DIAGNOSIS — R69 Illness, unspecified: Secondary | ICD-10-CM | POA: Diagnosis not present

## 2017-11-08 ENCOUNTER — Ambulatory Visit: Payer: Medicare HMO | Admitting: Cardiology

## 2017-11-08 ENCOUNTER — Encounter: Payer: Self-pay | Admitting: Cardiology

## 2017-11-08 VITALS — BP 112/68 | HR 110 | Ht 63.0 in | Wt 129.1 lb

## 2017-11-08 DIAGNOSIS — I10 Essential (primary) hypertension: Secondary | ICD-10-CM

## 2017-11-08 DIAGNOSIS — E7849 Other hyperlipidemia: Secondary | ICD-10-CM | POA: Diagnosis not present

## 2017-11-08 DIAGNOSIS — R29898 Other symptoms and signs involving the musculoskeletal system: Secondary | ICD-10-CM | POA: Diagnosis not present

## 2017-11-08 DIAGNOSIS — R0600 Dyspnea, unspecified: Secondary | ICD-10-CM | POA: Insufficient documentation

## 2017-11-08 DIAGNOSIS — R0609 Other forms of dyspnea: Secondary | ICD-10-CM | POA: Diagnosis not present

## 2017-11-08 DIAGNOSIS — R0602 Shortness of breath: Secondary | ICD-10-CM | POA: Diagnosis not present

## 2017-11-08 DIAGNOSIS — I350 Nonrheumatic aortic (valve) stenosis: Secondary | ICD-10-CM

## 2017-11-08 NOTE — Patient Instructions (Addendum)
Medication Instructions: Your physician recommends that you continue on your current medications as directed. Please refer to the Current Medication list given to you today.   Labwork: None Ordered  Procedures/Testing: Your physician has requested that you have an ankle brachial index (ABI). During this test an ultrasound and blood pressure cuff are used to evaluate the arteries that supply the arms and legs with blood. Allow thirty minutes for this exam. There are no restrictions or special instructions.   Your physician has recommended that you have a pulmonary function test. Pulmonary Function Tests are a group of tests that measure how well air moves in and out of your lungs.    Your physician has requested that you have a lexiscan myoview. For further information please visit [email or web address]. Please follow instruction sheet, as given.    Follow-Up: Your physician recommends that you schedule a follow-up appointment in: 3 months with Pecolia Ades NP or Dr. Acie Fredrickson    Any Additional Special Instructions Will Be Listed Below (If Applicable).     If you need a refill on your cardiac medications before your next appointment, please call your pharmacy.

## 2017-11-08 NOTE — Progress Notes (Signed)
Cardiology Office Note:    Date:  11/08/2017   ID:  Chase Hurst, DOB 07/11/1926, MRN [id number]  PCP:  [name], MD  Cardiologist:   Mertie Moores, MD  New today   Referring MD: [name], MD   Chief Complaint  Patient presents with  . Shortness of Breath    History of Present Illness:    Chase Hurst is a 84 y.o. male who is being seen today for the evaluation of dyspnea on exertion at the request of Burchette, Alinda Sierras, MD.   The patient has a past medical history significant for hypertension, hyperlipidemia, BPH.  He began to complain of dyspnea on exertion this spring.  He was seen by his primary care physician and noted to have increased dyspnea with activity such as taking the garbage cans out.  His BNP was normal.  He had no orthopnea or peripheral edema. An  echocardiogram 06/25/2017 showed normal LV systolic function with EF 55-60%, moderate LVH, normal wall motion, grade 1 diastolic dysfunction, elevated LV filling pressure, moderate aortic stenosis, trivial MR.  He has no prior history of CAD.  He has never smoked.  He has no history of chronic lung disease. No diabetes. He drinks 2 shots of brandy every evening.   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 He cares for his chronically ill wife. His son died suddenly of an apparent heart attack 3 weeks ago.   He is having shortness of breath with exertion like digging or carrying a box for about the last 2 months. It has not been getting progressively worse. He denies any chest pain/pressure/tigthness with exertion. He denies orthopnea, PND or edema. He denies palpitations, lightheadedness, near syncope or syncope.   He also he is unstable when he first gets up in the morning, like his legs are weak. He also gets leg weakness with walking a longer distance. He has always had a lot  of stamina but not so much lately.   No flowsheet data found.   Past Medical History:  Diagnosis Date  . HYPERLIPIDEMIA 02/25/2009  . Hypertension   . HYPERTROPHY PROSTATE W/UR OBST \\T \ OTH LUTS 02/25/2009    Past Surgical History:  Procedure Laterality Date  . HERNIA REPAIR  1935  . LESION REMOVAL Bilateral 08/06/2012   Procedure: EXCISION LEFT LIP AND RIGHT EYE LESION;  Surgeon: Rozetta Nunnery, MD;  Location: [facility];  Service: ENT;  Laterality: Bilateral;    Current Medications: No outpatient medications have been marked as taking for the 11/08/17 encounter (Office Visit) with Daune Perch, NP.     Allergies:   Patient has no known allergies.   Social History   Socioeconomic History  . Marital status: Married    Spouse name: Not on file  . Number of children: Not on file  . Years of education: Not on file  . Highest education level: Not on file  Occupational History  . Not on file  Social Needs  . Financial resource strain: Not on file  . Food insecurity:    Worry: Not on file    Inability: Not on file  . Transportation needs:    Medical: Not on file    Non-medical: Not on file  Tobacco Use  . Smoking status: Never Smoker  . Smokeless tobacco: Never Used  Substance and  Sexual Activity  . Alcohol use: Yes    Comment: 2-3 shots per day  . Drug use: No  . Sexual activity: Never  Lifestyle  . Physical activity:    Days per week: Not on file    Minutes per session: Not on file  . Stress: Not on file  Relationships  . Social connections:    Talks on phone: Not on file    Gets together: Not on file    Attends religious service: Not on file    Active member of club or organization: Not on file    Attends meetings of clubs or organizations: Not on file    Relationship status: Not on file  Other Topics Concern  . Not on file  Social History Narrative  . Not on file     Family History: The patient's family history includes Aneurysm  in his father; CAD in his son; Hypertension in his mother; Thyroid disease in his mother. ROS:   Please see the history of present illness.     All other systems reviewed and are negative.  EKGs/Labs/Other Studies Reviewed:    The following studies were reviewed today:  Echocardiogram 06/25/2017 Study Conclusions - Left ventricle: The cavity size was normal. Wall thickness was   increased in a pattern of moderate LVH. Systolic function was   normal. The estimated ejection fraction was in the range of 55%   to 60%. Wall motion was normal; there were no regional wall   motion abnormalities. Doppler parameters are consistent with   abnormal left ventricular relaxation (grade 1 diastolic   dysfunction). The E/e&' ratio is >15, suggesting elevated LV   filling pressure. - Aortic valve: Calcified leaflets - mobile filamentous structure   on the valve may be Lambl&'s excrescence. There is moderate   stenosis. Mean gradient (S): 11 mm Hg. Peak gradient (S): 18 mm   Hg. Valve area (VTI): 1.04 cm^2. Valve area (Vmax): 1.08 cm^2. - Mitral valve: Calcified annulus. Thickened and sclerotic   leaflets. Trivial regurgitation. - Left atrium: The atrium was normal in size. - Tricuspid valve: There was trivial regurgitation. - Pulmonic valve: There was mild regurgitation. - Pulmonary arteries: PA peak pressure: 25 mm Hg (S). - Inferior vena cava: The vessel was normal in size. The   respirophasic diameter changes were in the normal range (>= 50%),   consistent with normal central venous pressure.  Impressions: - LVEF 55-60%, moderate LVH, normal wall motion, grade 1 DD,   elevated LV filling pressure, moderate aortic stenosis, trival   MR, normal LA size, mild TR, RVSP 25 mmHg, normal IVC.  EKG:  EKG is ordered today.  The ekg ordered today demonstrates NSR 90 bpm  Recent Labs: 06/19/2017: ALT 17; Hemoglobin 13.3; Platelets 191.0; Pro B Natriuretic peptide (BNP) 30.0; TSH 1.34 09/25/2017: BUN  14; Creatinine, Ser 0.95; Potassium 4.1; Sodium 134   Recent Lipid Panel    Component Value Date/Time   CHOL 212 (H) 01/10/2017 0731   TRIG 93.0 01/10/2017 0731   HDL 93.20 01/10/2017 0731   CHOLHDL 2 01/10/2017 0731   VLDL 18.6 01/10/2017 0731   LDLCALC 100 (H) 01/10/2017 0731   LDLDIRECT 93.2 07/23/2012 0948    Physical Exam:    VS:  BP 112/68   Pulse (!) 110   Ht 5\' 3"  (1.6 m)   Wt 129 lb 1.9 oz (58.6 kg)   SpO2 94%   BMI 22.87 kg/m     Wt Readings from Last 3 Encounters:  11/08/17 129 lb 1.9 oz (58.6 kg)  09/25/17 129 lb 11.2 oz (58.8 kg)  06/26/17 127 lb 3.2 oz (57.7 kg)     GEN:  Well nourished, well developed elderly male in no acute distress HEENT: Normal NECK: No JVD; No carotid bruits LYMPHATICS: No lymphadenopathy CARDIAC: RRR, no rubs, gallops, 2/6 systolic murmur at RUSB RESPIRATORY:  Clear to auscultation without rales, wheezing or rhonchi  ABDOMEN: Soft, non-tender, non-distended MUSCULOSKELETAL:  No edema; No deformity  SKIN: Warm and dry NEUROLOGIC:  Alert and oriented x 3 PSYCHIATRIC:  Normal affect   ASSESSMENT:    1. Aortic stenosis, moderate   2. Dyspnea on exertion   3. Essential hypertension   4. Other hyperlipidemia    PLAN:    Pt was seen and examined by myself and Dr Acie Fredrickson in clinic today. This patient's case was discussed in depth with Dr. Acie Fredrickson. The plan below was formulated per our discussion.  In order of problems listed above:  Dyspnea on exertion: Patient referred by primary care for DOE over the past 2 months. No chest discomfort at all. No HF by BNP, echo, and no edema or orthopnea. EKG without ischemic changes and no arrhythmia.  CV risk factors include HTN, HLD, age. No diabetes, smoking, obesity  Will check lexiscan myoview to rule out myocardial ischemia.  He has never smoked and no hx of COPD, but his breathing appears like that of COPD with prolonged exhale. Will order PFT's.   Aortic stenosis: Echo 06/25/2017  showed mild aortic stenosis with mean gradient of 11 mmHg, valve area 1.04 cm. No pulse pressure deficit. Stable.   Hypertension: Currently on lisinopril-hydrochlorothiazide. BP well controlled.   Hyperlipidemia: Simvastatin 20 mg daily.  LDL was 111/2018 with an HDL of 93 and triglycerides 93  Leg weakness: Will check ABI's.    Medication Adjustments/Labs and Tests Ordered: Current medicines are reviewed at length with the patient today.  Concerns regarding medicines are outlined above. Labs and tests ordered and medication changes are outlined in the patient instructions below:  Patient Instructions  Medication Instructions: Your physician recommends that you continue on your current medications as directed. Please refer to the Current Medication list given to you today.   Labwork: None Ordered  Procedures/Testing: Your physician has requested that you have an ankle brachial index (ABI). During this test an ultrasound and blood pressure cuff are used to evaluate the arteries that supply the arms and legs with blood. Allow thirty minutes for this exam. There are no restrictions or special instructions.   Your physician has requested that you have a lexiscan myoview. For further information please visit [email or web address]. Please follow instruction sheet, as given.    Follow-Up: Your physician recommends that you schedule a follow-up appointment in: 3 months with Pecolia Ades NP or Dr. Acie Fredrickson    Any Additional Special Instructions Will Be Listed Below (If Applicable).     If you need a refill on your cardiac medications before your next appointment, please call your pharmacy.      Signed, Daune Perch, NP  11/08/2017 10:15 AM    [place]

## 2017-11-12 ENCOUNTER — Other Ambulatory Visit: Payer: Self-pay | Admitting: Cardiology

## 2017-11-12 DIAGNOSIS — R29898 Other symptoms and signs involving the musculoskeletal system: Secondary | ICD-10-CM

## 2017-11-14 ENCOUNTER — Telehealth (HOSPITAL_COMMUNITY): Payer: Self-pay

## 2017-11-14 NOTE — Telephone Encounter (Signed)
Encounter complete. 

## 2017-11-16 ENCOUNTER — Ambulatory Visit (HOSPITAL_BASED_OUTPATIENT_CLINIC_OR_DEPARTMENT_OTHER)
Admission: RE | Admit: 2017-11-16 | Discharge: 2017-11-16 | Disposition: A | Discharge status: Home / Self Care | Payer: Medicare HMO | Source: Ambulatory Visit | Attending: Cardiology | Admitting: Cardiology

## 2017-11-16 ENCOUNTER — Ambulatory Visit (HOSPITAL_COMMUNITY)
Admission: RE | Admit: 2017-11-16 | Discharge: 2017-11-16 | Disposition: A | Discharge status: Home / Self Care | Payer: Medicare HMO | Source: Ambulatory Visit | Attending: Cardiovascular Disease | Admitting: Cardiovascular Disease

## 2017-11-16 DIAGNOSIS — R29898 Other symptoms and signs involving the musculoskeletal system: Secondary | ICD-10-CM | POA: Insufficient documentation

## 2017-11-16 DIAGNOSIS — R0602 Shortness of breath: Secondary | ICD-10-CM | POA: Diagnosis not present

## 2017-11-16 DIAGNOSIS — R0609 Other forms of dyspnea: Secondary | ICD-10-CM

## 2017-11-16 DIAGNOSIS — R0600 Dyspnea, unspecified: Secondary | ICD-10-CM

## 2017-11-16 LAB — MYOCARDIAL PERFUSION IMAGING
CHL CUP NUCLEAR SRS: 2
CHL CUP NUCLEAR SSS: 3
LV sys vol: 23 mL
LVDIAVOL: 62 mL (ref 62–150)
NUC STRESS TID: 1
Peak HR: 120 {beats}/min
Rest HR: 82 {beats}/min
SDS: 1

## 2017-11-16 MED ORDER — TECHNETIUM TC 99M TETROFOSMIN IV KIT
10.6000 | PACK | Freq: Once | INTRAVENOUS | Status: AC | PRN
  Administered 2017-11-16: 10.6 via INTRAVENOUS
  Filled 2017-11-16: qty 11

## 2017-11-16 MED ORDER — TECHNETIUM TC 99M TETROFOSMIN IV KIT
30.1000 | PACK | Freq: Once | INTRAVENOUS | Status: AC | PRN
  Administered 2017-11-16: 30.1 via INTRAVENOUS
  Filled 2017-11-16: qty 31

## 2017-11-16 MED ORDER — REGADENOSON 0.4 MG/5ML IV SOLN
0.4000 mg | Freq: Once | INTRAVENOUS | Status: AC
  Administered 2017-11-16: 0.4 mg via INTRAVENOUS

## 2017-11-19 ENCOUNTER — Telehealth: Payer: Self-pay

## 2017-11-19 NOTE — Telephone Encounter (Signed)
-----   Message from Daune Perch, NP sent at 11/19/2017  3:54 PM EDT ----- Please let pt know that his ABI's were abnormal in that the leg arteries were non-compressible so they were unable to get pressures. The toe-brachial indexes were normal. No need for urgent further evaluation at this time. He can follow up with Dr. Acie Fredrickson at his planned visit.   Please send copy to [name], MD  Daune Perch, NP

## 2017-11-19 NOTE — Telephone Encounter (Signed)
Notes recorded by Frederik Schmidt, RN on 11/19/2017 at 5:16 PM EDT Informed patient of results (stress test and US)/recommendations. He verbalized understanding ------

## 2017-11-23 ENCOUNTER — Ambulatory Visit (INDEPENDENT_AMBULATORY_CARE_PROVIDER_SITE_OTHER): Payer: Medicare HMO | Admitting: Internal Medicine

## 2017-11-23 DIAGNOSIS — R0602 Shortness of breath: Secondary | ICD-10-CM

## 2017-11-23 DIAGNOSIS — R0609 Other forms of dyspnea: Secondary | ICD-10-CM | POA: Diagnosis not present

## 2017-11-23 DIAGNOSIS — R0600 Dyspnea, unspecified: Secondary | ICD-10-CM

## 2017-11-23 LAB — PULMONARY FUNCTION TEST
DL/VA % pred: 143 %
DL/VA: 5.69 ml/min/mmHg/L
DLCO UNC: 22.9 ml/min/mmHg
DLCO unc % pred: 104 %
FEF 25-75 Post: 1.31 L/sec
FEF 25-75 Pre: 1.41 L/sec
FEF2575-%Change-Post: -7 %
FEF2575-%Pred-Post: 157 %
FEF2575-%Pred-Pre: 169 %
FEV1-%CHANGE-POST: 1 %
FEV1-%PRED-PRE: 116 %
FEV1-%Pred-Post: 118 %
FEV1-PRE: 1.81 L
FEV1-Post: 1.84 L
FEV1FVC-%Change-Post: -4 %
FEV1FVC-%Pred-Pre: 102 %
FEV6-%Change-Post: 6 %
FEV6-%PRED-PRE: 118 %
FEV6-%Pred-Post: 125 %
FEV6-PRE: 2.53 L
FEV6-Post: 2.68 L
FEV6FVC-%PRED-POST: 111 %
FEV6FVC-%Pred-Pre: 111 %
FVC-%Change-Post: 6 %
FVC-%PRED-POST: 112 %
FVC-%PRED-PRE: 105 %
FVC-POST: 2.68 L
FVC-PRE: 2.53 L
Post FEV1/FVC ratio: 68 %
Post FEV6/FVC ratio: 100 %
Pre FEV1/FVC ratio: 72 %
Pre FEV6/FVC Ratio: 100 %
RV % pred: 98 %
RV: 2.4 L
TLC % pred: 89 %
TLC: 4.94 L

## 2017-11-23 NOTE — Progress Notes (Signed)
PFT completed today.  

## 2017-12-06 DIAGNOSIS — D3132 Benign neoplasm of left choroid: Secondary | ICD-10-CM | POA: Diagnosis not present

## 2017-12-06 DIAGNOSIS — H353123 Nonexudative age-related macular degeneration, left eye, advanced atrophic without subfoveal involvement: Secondary | ICD-10-CM | POA: Diagnosis not present

## 2017-12-06 DIAGNOSIS — D3131 Benign neoplasm of right choroid: Secondary | ICD-10-CM | POA: Diagnosis not present

## 2017-12-06 DIAGNOSIS — H353113 Nonexudative age-related macular degeneration, right eye, advanced atrophic without subfoveal involvement: Secondary | ICD-10-CM | POA: Diagnosis not present

## 2017-12-07 DIAGNOSIS — M79675 Pain in left toe(s): Secondary | ICD-10-CM | POA: Diagnosis not present

## 2017-12-07 DIAGNOSIS — M79674 Pain in right toe(s): Secondary | ICD-10-CM | POA: Diagnosis not present

## 2017-12-07 DIAGNOSIS — B351 Tinea unguium: Secondary | ICD-10-CM | POA: Diagnosis not present

## 2017-12-07 DIAGNOSIS — L600 Ingrowing nail: Secondary | ICD-10-CM | POA: Diagnosis not present

## 2017-12-20 ENCOUNTER — Other Ambulatory Visit: Payer: Self-pay | Admitting: Family Medicine

## 2017-12-27 NOTE — Progress Notes (Addendum)
Subjective:   Chase Hurst is a 84 y.o. male who presents for Medicare Annual/Subsequent preventive examination.  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  His father died 65   Diet BMI 23  Chol/hdl 2 Trig 93 Does not eat well No appetite; tries to eat  Drinks a chocolate protein drink in the am And everyone in the home drinks them  Only eats one meal a day; eats around 2 to 3;00 Beef roast recent;  Eats better at a restaurant; red lobster; had 1/2 oysters; fried shrimp, garden salad  Or [place] for ribs and baked potato   Has son retired Engineer, technical sales it may be testosterone   Exercise Last Sat walked 1/2 and that was up hill Goes to [employer] and Environmental education officer at the [employer]   132  ETOH 2 to 3 shots per day x 60 years   Dental issues 4 times a years   Health Maintenance Due  Topic Date Due  . TETANUS/TDAP  03/06/2017  . 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  Advanced  Directives 01/01/2018  Does Patient Have a Medical Advance Directive? Yes    Tobacco Social History   Tobacco Use  Smoking Status Never Smoker  Smokeless Tobacco Never Used     Counseling given: Yes   Clinical Intake:    Past Medical History:  Diagnosis Date  . HYPERLIPIDEMIA 02/25/2009  . Hypertension   . HYPERTROPHY PROSTATE W/UR OBST \\T \ OTH LUTS 02/25/2009   Past Surgical History:  Procedure Laterality Date  . HERNIA REPAIR  1935  . LESION REMOVAL Bilateral 08/06/2012   Procedure: EXCISION LEFT LIP AND RIGHT EYE LESION;  Surgeon: Rozetta Nunnery, MD;  Location: [facility];  Service: ENT;  Laterality: Bilateral;   Family History  Problem Relation Age of Onset  . Hypertension Mother   . Thyroid disease Mother   . Aneurysm Father   . CAD Son        died of MI at age 32   Social History   Socioeconomic History  . Marital status: Married    Spouse name: Not on file  . Number of children: Not on file  . Years of education: Not on file  . Highest education level: Not on  file  Occupational History  . Not on file  Social Needs  . Financial resource strain: Not on file  . Food insecurity:    Worry: Not on file    Inability: Not on file  . Transportation needs:    Medical: Not on file    Non-medical: Not on file  Tobacco Use  . Smoking status: Never Smoker  . Smokeless tobacco: Never Used  Substance and Sexual Activity  . Alcohol use: Yes    Comment: 2-3 shots per day- drinks these late evening   . Drug use: No  . Sexual activity: Never  Lifestyle  . Physical activity:    Days per week: Not on file    Minutes per session: Not on file  . Stress: Not on file  Relationships  . Social connections:    Talks on phone: Not on file    Gets together: Not on file    Attends religious service: Not on file    Active member of club or organization: Not on file    Attends meetings of clubs or organizations: Not on file    Relationship status:  Not on file  Other Topics Concern  . Not on file  Social History Narrative  . Not on file    Outpatient Encounter Medications as of 01/01/2018  Medication Sig  . Calcium Carbonate-Vitamin D 600-200 MG-UNIT CAPS Take by mouth daily.    Marland Kitchen dutasteride (AVODART) 0.5 MG capsule TAKE ONE CAPSULE BY MOUTH DAILY  . lisinopril-hydrochlorothiazide (PRINZIDE,ZESTORETIC) 10-12.5 MG tablet TAKE ONE TABLET BY MOUTH DAILY  . Multiple Vitamins-Minerals (CENTRUM ULTRA MENS) TABS Take by mouth daily.    Marland Kitchen OVER THE COUNTER MEDICATION Calcium with zinc  . simvastatin (ZOCOR) 20 MG tablet TAKE 1 TABLET BY MOUTH AT BEDTIME  . tadalafil (CIALIS) 20 MG tablet Take 20 mg by mouth daily as needed.     No facility-administered encounter medications on file as of 01/01/2018.     Activities of Daily Living In your present state of health, do you have any difficulty performing the following activities: 01/01/2018 06/26/2017  Hearing? N N  Vision? N N  Difficulty concentrating or making decisions? N N  Walking or climbing stairs? N N  Dressing or bathing? N N  Doing errands, shopping? N N  Preparing Food and eating ? N -  Using the Toilet? N -  In the past six months, have you accidently leaked urine? Y -  Comment enlarged prostate  -  Do you have problems with loss of bowel control? N -  Managing your Medications? N -  Managing your Finances? N -  Housekeeping or managing your Housekeeping? N -  Some recent data might be hidden    Patient Care Team: [name], MD as PCP - General Nahser, Wonda Cheng, MD as PCP - Cardiology (Cardiology)   Assessment:   This is a routine wellness examination for Chase Hurst.  Exercise Activities and Dietary recommendations Current Exercise Habits: Home exercise routine, Time (Minutes): 60, Frequency (Times/Week): 5, Weekly Exercise (Minutes/Week): 300, Intensity: Moderate  Goals    . Patient Stated     Keep going and taking care of wife        Fall Risk Fall  Risk  01/01/2018 01/10/2017 12/06/2015 08/28/2014 02/13/2013  Falls in the past year? No No No No No     Depression Screen PHQ 2/9 Scores 01/01/2018 01/10/2017 12/06/2015 08/28/2014  PHQ - 2 Score 0 0 0 0    Cognitive  Function     Ad8 score reviewed for issues:  Issues making decisions:  Less interest in hobbies / activities:  Repeats questions, stories (family complaining):  Trouble using ordinary gadgets (microwave, computer, phone):  Forgets the month or year:   Mismanaging finances:   Remembering appts:  Daily problems with thinking and/or memory: Ad8 score is=0        Immunization History  Administered Date(s) Administered  . Influenza, High Dose Seasonal PF 12/06/2015, 01/10/2017  . Influenza,inj,Quad PF,6+ Mos 02/13/2013, 02/09/2014  . Pneumococcal Conjugate-13 02/09/2014  . Pneumococcal Polysaccharide-23 07/19/2010  . Td 03/07/2007  . Zoster 08/08/2010      Screening Tests Health Maintenance  Topic Date Due  . TETANUS/TDAP  03/06/2017  . INFLUENZA VACCINE  10/04/2017  . 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 Medical and social history . Use of alcohol, tobacco or illicit drugs  . Current medications and supplements . Functional ability and status . Nutritional  status . Physical activity . Advanced directives . List of other physicians . Hospitalizations, surgeries, and ER visits in previous 12 months . Vitals . Screenings to include cognitive, depression, and falls . Referrals and appointments  In addition, I have reviewed and discussed with patient certain preventive protocols, quality metrics, and best practice recommendations. A written personalized care plan for preventive services as well as general preventive health recommendations were provided to patient.     WOEHO,ZYYQM, RN  01/01/2018  I have reviewed the documentation for the [facility] and [place] provided by the health coach and agree with their documentation. I was immediately available for any questions  [name] MD [facility]

## 2018-01-01 ENCOUNTER — Encounter: Payer: Self-pay | Admitting: Family Medicine

## 2018-01-01 ENCOUNTER — Ambulatory Visit (INDEPENDENT_AMBULATORY_CARE_PROVIDER_SITE_OTHER): Payer: Medicare HMO | Admitting: Family Medicine

## 2018-01-01 ENCOUNTER — Ambulatory Visit (INDEPENDENT_AMBULATORY_CARE_PROVIDER_SITE_OTHER): Payer: Medicare HMO

## 2018-01-01 ENCOUNTER — Other Ambulatory Visit: Payer: Self-pay

## 2018-01-01 VITALS — BP 126/70 | HR 79 | Temp 97.9°F | Ht 63.0 in | Wt 124.4 lb

## 2018-01-01 DIAGNOSIS — E7849 Other hyperlipidemia: Secondary | ICD-10-CM | POA: Diagnosis not present

## 2018-01-01 DIAGNOSIS — I10 Essential (primary) hypertension: Secondary | ICD-10-CM | POA: Diagnosis not present

## 2018-01-01 DIAGNOSIS — Z23 Encounter for immunization: Secondary | ICD-10-CM

## 2018-01-01 DIAGNOSIS — I350 Nonrheumatic aortic (valve) stenosis: Secondary | ICD-10-CM | POA: Diagnosis not present

## 2018-01-01 DIAGNOSIS — N400 Enlarged prostate without lower urinary tract symptoms: Secondary | ICD-10-CM

## 2018-01-01 DIAGNOSIS — N529 Male erectile dysfunction, unspecified: Secondary | ICD-10-CM

## 2018-01-01 DIAGNOSIS — Z0000 Encounter for general adult medical examination without abnormal findings: Secondary | ICD-10-CM

## 2018-01-01 MED ORDER — LISINOPRIL-HYDROCHLOROTHIAZIDE 10-12.5 MG PO TABS: 1.0000 | ORAL_TABLET | Freq: Every day | ORAL | 3 refills | Status: DC

## 2018-01-01 MED ORDER — SIMVASTATIN 20 MG PO TABS: 20.0000 mg | ORAL_TABLET | Freq: Every day | ORAL | 3 refills | Status: DC

## 2018-01-01 MED ORDER — TADALAFIL 20 MG PO TABS: ORAL_TABLET | ORAL | 3 refills | Status: DC

## 2018-01-01 MED ORDER — DUTASTERIDE 0.5 MG PO CAPS: 0.5000 mg | ORAL_CAPSULE | Freq: Every day | ORAL | 3 refills | Status: DC

## 2018-01-01 NOTE — Patient Instructions (Addendum)
[name] , Thank you for taking time to come for your Medicare Wellness Visit. I appreciate your ongoing commitment to your health goals. Please review the following plan we discussed and let me know if I can assist you in the future.    Update tetanus if you have an injury  Shingrix is a vaccine for the prevention of Shingles in Adults 50 and older.  If you are on Medicare, the shingrix is covered under your Part D plan, so you will take both of the vaccines in the series at your pharmacy. Please check with your benefits regarding applicable copays or out of pocket expenses.  Will take his flu vaccine today   The Shingrix is given in 2 vaccines approx 8 weeks apart. You must receive the 2nd dose prior to 6 months from receipt of the first. Please have the pharmacist print out you Immunization  dates for our office records  Shingrix is a vaccine for the prevention of Shingles in Adults 50 and older.  If you are on Medicare, the shingrix is covered under your Part D plan, so you will take both of the vaccines in the series at your pharmacy. Please check with your benefits regarding applicable copays or out of pocket expenses.  The Shingrix is given in 2 vaccines approx 8 weeks apart. You must receive the 2nd dose prior to 6 months from receipt of the first. Please have the pharmacist print out you Immunization  dates for our office records     These are the goals we discussed: Goals    . Patient Stated     Keep going and taking care of wife        This is a list of the screening recommended for you and due dates:  Health Maintenance  Topic Date Due  . Tetanus Vaccine  03/06/2017  . Flu Shot  10/04/2017  . Pneumonia vaccines  Completed      Fall Prevention in the Home Falls can cause injuries. They can happen to people of all ages. There are many things you can do to make your home safe and to help prevent falls. What can I do on the outside of my home?  Regularly fix the  edges of walkways and driveways and fix any cracks.  Remove anything that might make you trip as you walk through a door, such as a raised step or threshold.  Trim any bushes or trees on the path to your home.  Use bright outdoor lighting.  Clear any walking paths of anything that might make someone trip, such as rocks or tools.  Regularly check to see if handrails are loose or broken. Make sure that both sides of any steps have handrails.  Any raised decks and porches should have guardrails on the edges.  Have any leaves, snow, or ice cleared regularly.  Use sand or salt on walking paths during winter.  Clean up any spills in your garage right away. This includes oil or grease spills. What can I do in the bathroom?  Use night lights.  Install grab bars by the toilet and in the tub and shower. Do not use towel bars as grab bars.  Use non-skid mats or decals in the tub or shower.  If you need to sit down in the shower, use a plastic, non-slip stool.  Keep the floor dry. Clean up any water that spills on the floor as soon as it happens.  Remove soap buildup in the tub  or shower regularly.  Attach bath mats securely with double-sided non-slip rug tape.  Do not have throw rugs and other things on the floor that can make you trip. What can I do in the bedroom?  Use night lights.  Make sure that you have a light by your bed that is easy to reach.  Do not use any sheets or blankets that are too big for your bed. They should not hang down onto the floor.  Have a firm chair that has side arms. You can use this for support while you get dressed.  Do not have throw rugs and other things on the floor that can make you trip. What can I do in the kitchen?  Clean up any spills right away.  Avoid walking on wet floors.  Keep items that you use a lot in easy-to-reach places.  If you need to reach something above you, use a strong step stool that has a grab bar.  Keep  electrical cords out of the way.  Do not use floor polish or wax that makes floors slippery. If you must use wax, use non-skid floor wax.  Do not have throw rugs and other things on the floor that can make you trip. What can I do with my stairs?  Do not leave any items on the stairs.  Make sure that there are handrails on both sides of the stairs and use them. Fix handrails that are broken or loose. Make sure that handrails are as long as the stairways.  Check any carpeting to make sure that it is firmly attached to the stairs. Fix any carpet that is loose or worn.  Avoid having throw rugs at the top or bottom of the stairs. If you do have throw rugs, attach them to the floor with carpet tape.  Make sure that you have a light switch at the top of the stairs and the bottom of the stairs. If you do not have them, ask someone to add them for you. What else can I do to help prevent falls?  Wear shoes that: ? Do not have high heels. ? Have rubber bottoms. ? Are comfortable and fit you well. ? Are closed at the toe. Do not wear sandals.  If you use a stepladder: ? Make sure that it is fully opened. Do not climb a closed stepladder. ? Make sure that both sides of the stepladder are locked into place. ? Ask someone to hold it for you, if possible.  Clearly mark and make sure that you can see: ? Any grab bars or handrails. ? First and last steps. ? Where the edge of each step is.  Use tools that help you move around (mobility aids) if they are needed. These include: ? Canes. ? Walkers. ? Scooters. ? Crutches.  Turn on the lights when you go into a dark area. Replace any light bulbs as soon as they burn out.  Set up your furniture so you have a clear path. Avoid moving your furniture around.  If any of your floors are uneven, fix them.  If there are any pets around you, be aware of where they are.  Review your medicines with your doctor. Some medicines can make you feel dizzy.  This can increase your chance of falling. Ask your doctor what other things that you can do to help prevent falls. This information is not intended to replace advice given to you by your health care provider. Make sure you discuss  any questions you have with your health care provider. Document Released: 12/17/2008 Document Revised: 07/29/2015 Document Reviewed: 03/27/2014 Elsevier Interactive Patient Education  2018 [place] Maintenance, Male A healthy lifestyle and preventive care is important for your health and wellness. Ask your health care provider about what schedule of regular examinations is right for you. What should I know about weight and diet? Eat a Healthy Diet  Eat plenty of vegetables, fruits, whole grains, low-fat dairy products, and lean protein.  Do not eat a lot of foods high in solid fats, added sugars, or salt.  Maintain a Healthy Weight Regular exercise can help you achieve or maintain a healthy weight. You should:  Do at least 150 minutes of exercise each week. The exercise should increase your heart rate and make you sweat (moderate-intensity exercise).  Do strength-training exercises at least twice a week.  Watch Your Levels of Cholesterol and Blood Lipids  Have your blood tested for lipids and cholesterol every 5 years starting at 82 years of age. If you are at high risk for heart disease, you should start having your blood tested when you are 82 years old. You may need to have your cholesterol levels checked more often if: ? Your lipid or cholesterol levels are high. ? You are older than 82 years of age. ? You are at high risk for heart disease.  What should I know about cancer screening? Many types of cancers can be detected early and may often be prevented. Lung Cancer  You should be screened every year for lung cancer if: ? You are a current smoker who has smoked for at least 30 years. ? You are a former smoker who has quit within the  past 15 years.  Talk to your health care provider about your screening options, when you should start screening, and how often you should be screened.  Colorectal Cancer  Routine colorectal cancer screening usually begins at 82 years of age and should be repeated every 5-10 years until you are 82 years old. You may need to be screened more often if early forms of precancerous polyps or small growths are found. Your health care provider may recommend screening at an earlier age if you have risk factors for colon cancer.  Your health care provider may recommend using home test kits to check for hidden blood in the stool.  A small camera at the end of a tube can be used to examine your colon (sigmoidoscopy or colonoscopy). This checks for the earliest forms of colorectal cancer.  Prostate and Testicular Cancer  Depending on your age and overall health, your health care provider may do certain tests to screen for prostate and testicular cancer.  Talk to your health care provider about any symptoms or concerns you have about testicular or prostate cancer.  Skin Cancer  Check your skin from head to toe regularly.  Tell your health care provider about any new moles or changes in moles, especially if: ? There is a change in a mole's size, shape, or color. ? You have a mole that is larger than a pencil eraser.  Always use sunscreen. Apply sunscreen liberally and repeat throughout the day.  Protect yourself by wearing long sleeves, pants, a wide-brimmed hat, and sunglasses when outside.  What should I know about heart disease, diabetes, and high blood pressure?  If you are 72-26 years of age, have your blood pressure checked every 3-5 years. If you are 14 years of age  or older, have your blood pressure checked every year. You should have your blood pressure measured twice-once when you are at a hospital or clinic, and once when you are not at a hospital or clinic. Record the average of the two  measurements. To check your blood pressure when you are not at a hospital or clinic, you can use: ? An automated blood pressure machine at a pharmacy. ? A home blood pressure monitor.  Talk to your health care provider about your target blood pressure.  If you are between 6-54 years old, ask your health care provider if you should take aspirin to prevent heart disease.  Have regular diabetes screenings by checking your fasting blood sugar level. ? If you are at a normal weight and have a low risk for diabetes, have this test once every three years after the age of 62. ? If you are overweight and have a high risk for diabetes, consider being tested at a younger age or more often.  A one-time screening for abdominal aortic aneurysm (AAA) by ultrasound is recommended for men aged 39-75 years who are current or former smokers. What should I know about preventing infection? Hepatitis B If you have a higher risk for hepatitis B, you should be screened for this virus. Talk with your health care provider to find out if you are at risk for hepatitis B infection. Hepatitis C Blood testing is recommended for:  Everyone born from [birth year] through 1963.  Anyone with known risk factors for hepatitis C.  Sexually Transmitted Diseases (STDs)  You should be screened each year for STDs including gonorrhea and chlamydia if: ? You are sexually active and are younger than 82 years of age. ? You are older than 82 years of age and your health care provider tells you that you are at risk for this type of infection. ? Your sexual activity has changed since you were last screened and you are at an increased risk for chlamydia or gonorrhea. Ask your health care provider if you are at risk.  Talk with your health care provider about whether you are at high risk of being infected with HIV. Your health care provider may recommend a prescription medicine to help prevent HIV infection.  What else can I do?  Schedule  regular health, dental, and eye exams.  Stay current with your vaccines (immunizations).  Do not use any tobacco products, such as cigarettes, chewing tobacco, and e-cigarettes. If you need help quitting, ask your health care provider.  Limit alcohol intake to no more than 2 drinks per day. One drink equals 12 ounces of beer, 5 ounces of wine, or 1 ounces of hard liquor.  Do not use street drugs.  Do not share needles.  Ask your health care provider for help if you need support or information about quitting drugs.  Tell your health care provider if you often feel depressed.  Tell your health care provider if you have ever been abused or do not feel safe at home. This information is not intended to replace advice given to you by your health care provider. Make sure you discuss any questions you have with your health care provider. Document Released: 08/19/2007 Document Revised: 10/20/2015 Document Reviewed: 11/24/2014 Elsevier Interactive Patient Education  [employer].

## 2018-01-01 NOTE — Progress Notes (Signed)
Subjective:     Patient ID: Chase Hurst, male   DOB: 06/12/1926, 84 y.o.   MRN: [id number]  HPI Patient is seen for medical follow-up.  He was seen last summer with some increased dyspnea.  We set up echocardiogram which revealed moderate aortic stenosis.  He was seen by cardiology and had a nuclear stress test which showed no evidence for ischemia.  Has not had any chest pain.  Feels his dyspnea is somewhat improved at this time.  No recent cough.  Has had some mild weight loss.  He has been battling poor appetite for several months.  Denies any abdominal pain or other concerns.  Generally feels well overall.  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  Stays very active.  Also very busy helping take care of his wife who has multiple medical needs.  Medications reviewed.  Compliant with all.  No dizziness.  Denies recent falls.  Mood stable.  Urine flow is good.  He remains on Avodart. Had Medicare wellness visit this morning and received flu vaccine then  Past Medical History:  Diagnosis Date  . HYPERLIPIDEMIA 02/25/2009  . Hypertension   . HYPERTROPHY PROSTATE W/UR OBST \\T \ OTH LUTS 02/25/2009   Past Surgical History:  Procedure Laterality Date  . HERNIA REPAIR  1935  . LESION REMOVAL Bilateral 08/06/2012   Procedure: EXCISION LEFT LIP AND RIGHT EYE LESION;  Surgeon: Rozetta Nunnery, MD;  Location: [facility];  Service: ENT;  Laterality: Bilateral;    reports that he has never smoked. He has never used smokeless tobacco. He reports that he drinks alcohol. He reports that he does not use drugs. family history includes Aneurysm in his father; CAD in his son; Hypertension in his mother; Thyroid disease in his mother. No Known Allergies   Review of Systems  Constitutional: Negative for chills, fatigue and fever.  HENT: Negative for trouble swallowing.   Eyes: Negative for visual disturbance.  Respiratory: Negative for cough, chest  tightness and shortness of breath.   Cardiovascular: Negative for chest pain, palpitations and leg swelling.  Gastrointestinal: Negative for abdominal pain.  Genitourinary: Negative for dysuria.  Neurological: Negative for dizziness, syncope, weakness, light-headedness and headaches.       Objective:   Physical Exam  Constitutional: He is oriented to person, place, and time. He appears well-developed and well-nourished. No distress.  HENT:  Head: Normocephalic and atraumatic.  Right Ear: External ear normal.  Left Ear: External ear normal.  Mouth/Throat: Oropharynx is clear and moist.  Eyes: Pupils are equal, round, and reactive to light. Conjunctivae and EOM are normal.  Neck: Normal range of motion. Neck supple. No thyromegaly present.  Cardiovascular: Normal rate, regular rhythm and normal heart sounds.  No murmur heard. Pulmonary/Chest: No respiratory distress. He has no wheezes. He has no rales.  Abdominal: Soft. Bowel sounds are normal. He exhibits no distension and no mass. There is no tenderness. There is no rebound and no guarding.  Musculoskeletal: He exhibits no edema.  Lymphadenopathy:    He has no cervical adenopathy.  Neurological: He is alert and oriented to person, place, and time. He displays normal reflexes. No cranial nerve deficit.  Skin: No rash noted.  Psychiatric: He has a normal mood and affect.       Assessment:     #1 hypertension stable and at goal  #2 hyperlipidemia  #3 history of BPH symptomatically stable and on Avodart  #4 aortic stenosis  by echocardiogram last year.  No progressive dyspnea.  No syncope.  #5 history of erectile dysfunction    Plan:     -Refilled all of his regular medications -Flu vaccine given this morning -Needs follow-up lipids.  He was not fasting today.  He requests coming back later for that and we will defer until follow-up in the spring -Reminder to keep cardiology follow-up early December  [name]  MD [facility] at [facility]

## 2018-02-10 NOTE — Progress Notes (Signed)
Cardiology Office Note:    Date:  02/11/2018   ID:  Chase Hurst, DOB 05-Jul-1926, MRN [id number]  PCP:  Chase Post, MD  Cardiologist:  Chase Moores, MD  Electrophysiologist:  None   Referring MD: Chase Post, MD   Chief Complaint  Patient presents with  . Shortness of Breath     Dec. 9, 2019    Chase Hurst is a 84 y.o. male with a hx of dyspnea on exertion.   He was seen by Chase Hurst in Sept for DOE and HTN, HLD. Echo in April, 2019 shows normal LV systolic function,  Grade 1 diastolic dysfunction Has mild AS with mean AV gradient of 11 mmHg.  Does not smoke Drinks 2 shots every evening Retired from [employer],   Is a Doctor, general practice, Had a myoview study in Sept. 2019 which showed no ischemia and EF of 63%  Breathing is much better now. Non of his presenting symptoms are present anymore  He thinks his "Lousy Diet" contributed to this  Is eating better now .   His wife is disabled.   He has a helper to help look after her.  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     Past Medical History:  Diagnosis Date  . HYPERLIPIDEMIA 02/25/2009  . Hypertension   . HYPERTROPHY PROSTATE W/UR OBST \\T \ OTH LUTS 02/25/2009    Past Surgical History:  Procedure Laterality Date  . HERNIA REPAIR  1935  . LESION REMOVAL Bilateral 08/06/2012   Procedure: EXCISION LEFT LIP AND RIGHT EYE LESION;  Surgeon: Chase Nunnery, MD;  Location: [facility];  Service: ENT;  Laterality: Bilateral;    Current Medications: Current Meds  Medication Sig  . Calcium Carbonate-Vitamin D 600-200 MG-UNIT CAPS Take by mouth daily.    Marland Kitchen dutasteride (AVODART) 0.5 MG capsule Take 1 capsule (0.5 mg total) by mouth daily.  Marland Kitchen lisinopril-hydrochlorothiazide (PRINZIDE,ZESTORETIC) 10-12.5 MG tablet Take 1 tablet by mouth daily.  . Multiple Vitamins-Minerals  (CENTRUM ULTRA MENS) TABS Take by mouth daily.    Marland Kitchen OVER THE COUNTER MEDICATION Calcium with zinc  . simvastatin (ZOCOR) 20 MG tablet Take 1 tablet (20 mg total) by mouth at bedtime.  . tadalafil (ADCIRCA/CIALIS) 20 MG tablet Take one every other day as needed for erectile dysfunction.     Allergies:   Patient has no known allergies.   Social History   Socioeconomic History  . Marital status: Married    Spouse name: Not on file  . Number of children: Not on file  . Years of education: Not on file  . Highest education level: Not on file  Occupational History  . Not on file  Social Needs  . Financial resource strain: Not on file  . Food insecurity:    Worry: Not on file    Inability: Not on file  . Transportation needs:    Medical: Not on file    Non-medical: Not on file  Tobacco Use  . Smoking status: Never Smoker  . Smokeless tobacco: Never Used  Substance and Sexual Activity  . Alcohol use: Yes    Comment: 2-3 shots per day- drinks these late evening   . Drug use: No  . Sexual activity: Never  Lifestyle  . Physical activity:    Days per week: Not on file    Minutes  per session: Not on file  . Stress: Not on file  Relationships  . Social connections:    Talks on phone: Not on file    Gets together: Not on file    Attends religious service: Not on file    Active member of club or organization: Not on file    Attends meetings of clubs or organizations: Not on file    Relationship status: Not on file  Other Topics Concern  . Not on file  Social History Narrative  . Not on file     Family History: The patient's family history includes Aneurysm in his father; CAD in his son; Hypertension in his mother; Thyroid disease in his mother.  ROS:   Please see the history of present illness.     All other systems reviewed and are negative.  EKGs/Labs/Other Studies Reviewed:     EKG:    Recent Labs: 06/19/2017: ALT 17; Hemoglobin 13.3; Platelets 191.0; Pro B  Natriuretic peptide (BNP) 30.0; TSH 1.34 09/25/2017: BUN 14; Creatinine, Ser 0.95; Potassium 4.1; Sodium 134  Recent Lipid Panel    Component Value Date/Time   CHOL 212 (H) 01/10/2017 0731   TRIG 93.0 01/10/2017 0731   HDL 93.20 01/10/2017 0731   CHOLHDL 2 01/10/2017 0731   VLDL 18.6 01/10/2017 0731   LDLCALC 100 (H) 01/10/2017 0731   LDLDIRECT 93.2 07/23/2012 0948    Physical Exam:    VS:  BP (!) 122/56   Pulse 84   Ht 5\' 3"  (1.6 m)   Wt 129 lb (58.5 kg)   SpO2 96%   BMI 22.85 kg/m     Wt Readings from Last 3 Encounters:  02/11/18 129 lb (58.5 kg)  01/01/18 124 lb 6.4 oz (56.4 kg)  01/01/18 129 lb (58.5 kg)     GEN: Elderly gentleman, no acute distress HEENT: Normal NECK: No JVD; No carotid bruits LYMPHATICS: No lymphadenopathy CARDIAC: RR, 1-2 / 6 systolic murmur  RESPIRATORY:  Clear to auscultation without rales, wheezing or rhonchi  ABDOMEN: Soft, non-tender, non-distended MUSCULOSKELETAL:  No edema; No deformity  SKIN: Warm and dry NEUROLOGIC:  Alert and oriented x 3 PSYCHIATRIC:  Normal affect   ASSESSMENT:    No diagnosis found. PLAN:    In order of problems listed above:  1. Shortness of breath: [name] seems to be doing better.  He is not having nearly as much shortness of breath.  Changes in medications at this time.  2.  Aortic stenosis: He has mild to moderate aortic stenosis.    His aortic stenosis does not seem to be severe enough to be causing his symptoms.   Medication Adjustments/Labs and Tests Ordered: Current medicines are reviewed at length with the patient today.  Concerns regarding medicines are outlined above.  No orders of the defined types were placed in this encounter.  No orders of the defined types were placed in this encounter.   Patient Instructions  Medication Instructions:  Your physician recommends that you continue on your current medications as directed. Please refer to the Current Medication list given to you today.  If  you need a refill on your cardiac medications before your next appointment, please call your pharmacy.    Lab work: None Ordered    Testing/Procedures: None Ordered   Follow-Up: At [facility], you and your health needs are our priority.  As part of our continuing mission to provide you with exceptional heart care, we have created designated Provider Care Teams.  These Care  Teams include your primary Cardiologist (physician) and Advanced Practice Providers (APPs -  Physician Assistants and Nurse Practitioners) who all work together to provide you with the care you need, when you need it. You will need a follow up appointment in:  6 months.  Please call our office 2 months in advance to schedule this appointment.  You may see Chase Moores, MD or one of the following Advanced Practice Providers on your designated Care Team: Richardson Dopp, PA-C [place], [place] . Daune Perch, NP       Signed, Chase Moores, MD  02/11/2018 11:55 AM    [place]

## 2018-02-11 ENCOUNTER — Encounter: Payer: Self-pay | Admitting: Cardiovascular Disease

## 2018-02-11 ENCOUNTER — Ambulatory Visit: Payer: Medicare HMO | Admitting: Cardiovascular Disease

## 2018-02-11 VITALS — BP 122/56 | HR 84 | Ht 63.0 in | Wt 129.0 lb

## 2018-02-11 DIAGNOSIS — I350 Nonrheumatic aortic (valve) stenosis: Secondary | ICD-10-CM

## 2018-02-11 NOTE — Patient Instructions (Signed)

## 2018-02-13 DIAGNOSIS — R69 Illness, unspecified: Secondary | ICD-10-CM | POA: Diagnosis not present

## 2018-03-04 ENCOUNTER — Other Ambulatory Visit: Payer: Self-pay

## 2018-03-04 ENCOUNTER — Encounter: Payer: Self-pay | Admitting: Family Medicine

## 2018-03-04 ENCOUNTER — Ambulatory Visit (INDEPENDENT_AMBULATORY_CARE_PROVIDER_SITE_OTHER): Payer: Medicare HMO | Admitting: Family Medicine

## 2018-03-04 VITALS — BP 126/68 | HR 82 | Temp 97.7°F | Ht 63.0 in | Wt 125.4 lb

## 2018-03-04 DIAGNOSIS — M79604 Pain in right leg: Secondary | ICD-10-CM | POA: Diagnosis not present

## 2018-03-04 DIAGNOSIS — R634 Abnormal weight loss: Secondary | ICD-10-CM

## 2018-03-04 NOTE — Patient Instructions (Signed)

## 2018-03-04 NOTE — Progress Notes (Signed)
Subjective:     Patient ID: Chase Hurst, male   DOB: January 23, 1927, 84 y.o.   MRN: [id number]  HPI Patient is seen for the following issues.  He is being worked in with right posterior knee pain for the past 3 weeks or so.  He denies any specific injury.  His pain is mostly with walking though he noticed some pain last night at rest in bed.  He has not noted any swelling.  No chest pain.  No dyspnea.  No pleuritic pain.  He had arterial Dopplers back in November with noncompressible arteries but toe brachial index was normal.  He does not describe any typical claudication type symptoms.  He has not noted any tenderness to palpation and no visible swelling.  He tried some topical BenGay without relief.  He has had difficulty gaining weight.  He has appetite which is somewhat up and down.  He also states that he does not fix/prepare a lot of foods.  Eats out occasionally.  His wife has several health problems and he hesitates to cook much.  Frequently only eats 1 meal a day..  Sometimes does high calorie shakes but inconsistently.  It is actually stable is actually up 1 pound from back in October so this seems to be relatively stable.  His albumin was 4.2 last April.  Past Medical History:  Diagnosis Date  . HYPERLIPIDEMIA 02/25/2009  . Hypertension   . HYPERTROPHY PROSTATE W/UR OBST \\T \ OTH LUTS 02/25/2009   Past Surgical History:  Procedure Laterality Date  . HERNIA REPAIR  1935  . LESION REMOVAL Bilateral 08/06/2012   Procedure: EXCISION LEFT LIP AND RIGHT EYE LESION;  Surgeon: Rozetta Nunnery, MD;  Location: [facility] [facility];  Service: ENT;  Laterality: Bilateral;    reports that he has never smoked. He has never used smokeless tobacco. He reports current alcohol use. He reports that he does not use drugs. family history includes Aneurysm in his father; CAD in his son; Hypertension in his mother; Thyroid disease in his mother. No Known Allergies   Review of Systems   Constitutional: Negative for chills and fever.  HENT: Negative for trouble swallowing.   Respiratory: Negative for cough and shortness of breath.   Cardiovascular: Negative for chest pain.  Endocrine: Negative for polydipsia and polyuria.  Genitourinary: Negative for dysuria.  Musculoskeletal: Negative for back pain.  Skin: Negative for rash.  Hematological: Negative for adenopathy.       Objective:   Physical Exam Constitutional:      Appearance: Normal appearance.  Cardiovascular:     Rate and Rhythm: Normal rate and regular rhythm.     Pulses: Normal pulses.     Comments: He has 2+ dorsalis pedis pulses bilaterally.  Excellent capillary refill.  Both feet are warm to touch. Pulmonary:     Effort: Pulmonary effort is normal.     Breath sounds: Normal breath sounds.  Musculoskeletal:     Right lower leg: No edema.     Left lower leg: No edema.     Comments: Good range of motion right knee.  No effusion.  No calf tenderness.  No calf pain with plantarflexion or dorsiflexion.  He has some minimal distal medial hamstring tenderness to palpation.  He also has some mild pain when flexing the knee against resistance.  Neurological:     Mental Status: He is alert.        Assessment:     #1 right posterior knee pain.  Suspect hamstring tendon strain.  No visible swelling.  He does not have any calf tenderness or swelling to suggest likely calf muscle strain and no pain with plantarflexion.  No clinical evidence to suggest DVT.  No evidence to suggest claudication or arterial issues  #2 weight loss and difficulty gaining weight.  Thyroid function was normal last April.  Very erratic eating habits.  His weight is actually stable compared to a few months ago    Plan:     -We recommend try some icing 20 to 30 minutes 2 times daily to hamstring tendon region.  Try some gentle stretches.  Offered physical therapy but he declines at this point.  -We discussed diet in some detail.  We  also discussed possible supplements such as mirtazapine but this point he wishes to wait.  We suggest that he try some homemade smoothies with protein supplement since he does not like commercially available protein shakes.  [name] MD [facility] at [facility]

## 2018-03-18 ENCOUNTER — Encounter: Payer: Self-pay | Admitting: Family Medicine

## 2018-03-18 ENCOUNTER — Ambulatory Visit (INDEPENDENT_AMBULATORY_CARE_PROVIDER_SITE_OTHER): Payer: Medicare HMO | Admitting: Family Medicine

## 2018-03-18 ENCOUNTER — Other Ambulatory Visit: Payer: Self-pay

## 2018-03-18 ENCOUNTER — Ambulatory Visit (INDEPENDENT_AMBULATORY_CARE_PROVIDER_SITE_OTHER): Payer: Medicare HMO

## 2018-03-18 VITALS — BP 114/62 | HR 94 | Temp 97.6°F | Ht 63.0 in | Wt 132.8 lb

## 2018-03-18 DIAGNOSIS — M7989 Other specified soft tissue disorders: Secondary | ICD-10-CM | POA: Diagnosis not present

## 2018-03-18 DIAGNOSIS — M25561 Pain in right knee: Secondary | ICD-10-CM

## 2018-03-18 DIAGNOSIS — M25461 Effusion, right knee: Secondary | ICD-10-CM

## 2018-03-18 MED ORDER — METHYLPREDNISOLONE ACETATE 40 MG/ML IJ SUSP
40.0000 mg | Freq: Once | INTRAMUSCULAR | Status: AC
  Administered 2018-03-18: 40 mg via INTRA_ARTICULAR

## 2018-03-18 NOTE — Progress Notes (Signed)
  Subjective:     Patient ID: Chase Hurst, male   DOB: 20-Feb-1927, 85 y.o.   MRN: [id number]  HPI Patient is seen with continued pain around the right knee.  No injury.  Since last visit he has now noticed some swelling and low-grade warmth in the right knee.  He states his father had gout but he has never had gout or any known history of pseudogout.  He has not noticed any redness.  No bruising.  No fevers or chills.  He has occasional pain rating down to the ankle and dorsum of the foot.  Pain with ambulation.  Past Medical History:  Diagnosis Date  . HYPERLIPIDEMIA 02/25/2009  . Hypertension   . HYPERTROPHY PROSTATE W/UR OBST \\T \ OTH LUTS 02/25/2009   Past Surgical History:  Procedure Laterality Date  . HERNIA REPAIR  1935  . LESION REMOVAL Bilateral 08/06/2012   Procedure: EXCISION LEFT LIP AND RIGHT EYE LESION;  Surgeon: Rozetta Nunnery, MD;  Location: [place];  Service: ENT;  Laterality: Bilateral;    reports that he has never smoked. He has never used smokeless tobacco. He reports current alcohol use. He reports that he does not use drugs. family history includes Aneurysm in his father; CAD in his son; Hypertension in his mother; Thyroid disease in his mother. No Known Allergies   Review of Systems  Constitutional: Negative for chills and fever.  Musculoskeletal: Positive for arthralgias.  Neurological: Negative for weakness and numbness.       Objective:   Physical Exam Constitutional:      Appearance: Normal appearance.  Cardiovascular:     Rate and Rhythm: Normal rate.  Musculoskeletal:     Comments: Right knee shows mild effusion.  Has some warmth to the touch compared to the left.  Full range of motion.  No specific bony tenderness.  No ligament instability no leg edema.  Right foot is warm to touch.  Neurological:     Mental Status: He is alert.        Assessment:     Right knee effusion.  X-ray reveals very well-preserved joint  spaces especially for his age but he does have question of some linear calcification which brings up question of possible pseudogout    Plan:     -Not a good candidate for nonsteroidals because of his age. -We discussed risk and benefits of corticosteroid injection right knee including risk of bleeding, bruising, infection and patient consented.  We prepped the knee with Betadine.  Using sterile technique injected 40 mg Depo-Medrol and 2 cc of plain Xylocaine using 25-gauge one and 1/2 inch needle.  We enter the joint space just inferior and medial to patella.  Patient tolerated well -He will try some icing tonight.  Touch base by next week if not improving.  If no further improvement with the above recommend consideration for orthopedic or sports medicine referral  [name] MD [facility] at [facility]

## 2018-03-18 NOTE — Patient Instructions (Signed)
Calcium Pyrophosphate Deposition Calcium pyrophosphate deposition (CPPD) is a type of arthritis that causes pain, swelling, and inflammation in a joint. Attacks of CPPD may come and go. The joint pain can be severe and may last for days to weeks. This condition usually affects one joint at a time. The knees are most often affected, but this condition can also affect the wrists, elbows, shoulders, or ankles. CPPD may also be called pseudogout because it is similar to gout. Both conditions result from the buildup of crystals in a joint. However, CPPD is caused by a type of crystal that is different from the crystals that cause gout. What are the causes? This condition is caused by the buildup of calcium pyrophosphate dihydrate crystals in a joint. The reason why this buildup occurs is not known. An increased likelihood of having this condition (predisposition) may be passed from parent to child (is hereditary). What increases the risk? This condition is more likely to develop in people who:  Are older than 83 years of age.  Have a family history of CPPD.  Have certain medical conditions, such as hemophilia, amyloidosis, or overactive parathyroid glands.  Have low levels of magnesium in the blood. What are the signs or symptoms? Symptoms of this condition include:  Joint pain. The pain may: ? Be intense and constant. ? Develop quickly. ? Get worse with movement. ? Last from several days to a few weeks.  Redness, swelling, stiffness, and warmth at the joint. How is this diagnosed? To diagnose this condition, your health care provider will use a needle to remove fluid from the joint. The fluid will be examined for the crystals that cause CPPD. You also may have additional tests, such as:  Blood tests.  X-rays.  Ultrasound.  MRI. How is this treated? There is no way to remove the crystals from the joint and no cure for this condition. However, treatment can relieve symptoms and improve  joint function. Treatment may include:  NSAIDs to reduce inflammation and pain.  Removing some of the fluid and crystals from around the joint with a needle.  Injections of medicine (cortisone) into the joint to reduce pain and swelling.  Medicines to help prevent attacks.  Physical therapy to improve joint function. Follow these instructions at home: Managing pain, stiffness, and swelling   Rest the affected joint until your symptoms start to go away.  If directed, put ice on the affected area to relieve pain and swelling: ? Put ice in a plastic bag. ? Place a towel between your skin and the bag. ? Leave the ice on for 20 minutes, 2-3 times a day.  Keep your affected joint raised (elevated) above the level of your heart, when possible. This will help to reduce swelling. For example, prop your foot up on a chair while sitting down to elevate your knee. General instructions  If the painful joint is in your leg, use crutches as told by your health care provider.  Take over-the-counter and prescription medicines only as told by your health care provider.  When your symptoms start to go away, begin to exercise regularly or do physical therapy. Talk with your health care provider or physical therapist about what types of exercise are safe for you. Exercise that is easier on your joints (low-impact exercise) may be best. This includes walking, swimming, bicycling, and water aerobics.  Maintain a healthy weight. Excess weight puts stress on your joints.  Keep all follow-up visits as told by your health care provider  and physical therapist. This is important. Contact a health care provider if you:  Notice that your symptoms get worse.  Develop a skin rash.  Notice that your pain gets worse. Get help right away if you:  Have a fever.  Have difficulty breathing.  Are taking NSAIDs and you: ? Vomit blood. ? Have blood in your stool. ? Have stool that is tarry and  black. Summary  Calcium pyrophosphate deposition (CPPD) is a type of arthritis that causes pain, swelling, and inflammation in a joint. The knees are most often affected, but it can also affect the wrists, elbows, shoulders, or ankles.  CPPD is caused by the buildup of calcium crystals in a joint. The reason why this occurs is not known.  Attacks of CPPD may come and go. The joint pain can be severe and may last for days to weeks.  There is no way to remove the crystals from the joint and no cure for this condition. However, treatment can relieve symptoms and improve joint function.  Rest the affected joint until your symptoms start to go away. After your symptoms go away, begin to exercise regularly or do physical therapy. This information is not intended to replace advice given to you by your health care provider. Make sure you discuss any questions you have with your health care provider. Document Released: 11/13/2003 Document Revised: 12/19/2016 Document Reviewed: 12/19/2016 Elsevier Interactive Patient Education  2019 [employer].  Let me know in one week if pain no better.

## 2018-03-30 DIAGNOSIS — R55 Syncope and collapse: Secondary | ICD-10-CM | POA: Diagnosis not present

## 2018-03-30 DIAGNOSIS — G459 Transient cerebral ischemic attack, unspecified: Secondary | ICD-10-CM | POA: Diagnosis not present

## 2018-03-30 DIAGNOSIS — R000 Tachycardia, unspecified: Secondary | ICD-10-CM | POA: Diagnosis not present

## 2018-03-30 DIAGNOSIS — I499 Cardiac arrhythmia, unspecified: Secondary | ICD-10-CM | POA: Diagnosis not present

## 2018-03-30 DIAGNOSIS — E860 Dehydration: Secondary | ICD-10-CM | POA: Diagnosis not present

## 2018-04-30 ENCOUNTER — Other Ambulatory Visit: Payer: Self-pay | Admitting: Family Medicine

## 2018-05-14 DIAGNOSIS — D3132 Benign neoplasm of left choroid: Secondary | ICD-10-CM | POA: Diagnosis not present

## 2018-05-14 DIAGNOSIS — H353113 Nonexudative age-related macular degeneration, right eye, advanced atrophic without subfoveal involvement: Secondary | ICD-10-CM | POA: Diagnosis not present

## 2018-05-14 DIAGNOSIS — H353123 Nonexudative age-related macular degeneration, left eye, advanced atrophic without subfoveal involvement: Secondary | ICD-10-CM | POA: Diagnosis not present

## 2018-08-05 ENCOUNTER — Telehealth: Payer: Self-pay | Admitting: Nurse Practitioner

## 2018-08-05 NOTE — Telephone Encounter (Signed)
Patient agrees to have weight, BP, and pulse readings ready for his visit  [place] E-VISIT FOR YOUR APPOINTMENT - PLEASE REVIEW IMPORTANT INFORMATION BELOW SEVERAL DAYS PRIOR TO YOUR APPOINTMENT  Due to the recent COVID-19 pandemic, we are transitioning in-person office visits to tele-medicine visits in an effort to decrease unnecessary exposure to our patients, their families, and staff. These visits are billed to your insurance just like a normal visit is. We also encourage you to sign up for MyChart if you have not already done so. You will need a smartphone if possible. For patients that do not have this, we can still complete the visit using a regular telephone but do prefer a smartphone to enable video when possible. You may have a family member that lives with you that can help. If possible, we also ask that you have a blood pressure cuff and scale at home to measure your blood pressure, heart rate and weight prior to your scheduled appointment. Patients with clinical needs that need an in-person evaluation and testing will still be able to come to the office if absolutely necessary. If you have any questions, feel free to call our office.   YOUR PROVIDER WILL BE USING THE FOLLOWING PLATFORM TO COMPLETE YOUR VISIT: Telephone   2-3 DAYS BEFORE YOUR APPOINTMENT  You will receive a telephone call from one of our [facility] team members - your caller ID may say "Unknown caller." If this is a video visit, we will walk you through how to get the video launched on your phone. We will remind you check your blood pressure, heart rate and weight prior to your scheduled appointment. If you have an Apple Watch or Kardia, please upload any pertinent ECG strips the day before or morning of your appointment to [facility]. Our staff will also make sure you have reviewed the consent and agree to move forward with your scheduled tele-health visit.    THE DAY OF YOUR  APPOINTMENT  Approximately 15 minutes prior to your scheduled appointment, you will receive a telephone call from one of [facility] team - your caller ID may say "Unknown caller."  Our staff will confirm medications, vital signs for the day and any symptoms you may be experiencing. Please have this information available prior to the time of visit start. It may also be helpful for you to have a pad of paper and pen handy for any instructions given during your visit. They will also walk you through joining the smartphone meeting if this is a video visit.    CONSENT FOR TELE-HEALTH VISIT - PLEASE REVIEW  I hereby voluntarily request, consent and authorize [facility] and its employed or contracted physicians, physician assistants, nurse practitioners or other licensed health care professionals (the Practitioner), to provide me with telemedicine health care services (the "Services") as deemed necessary by the treating Practitioner. I acknowledge and consent to receive the Services by the Practitioner via telemedicine. I understand that the telemedicine visit will involve communicating with the Practitioner through live audiovisual communication technology and the disclosure of certain medical information by electronic transmission. I acknowledge that I have been given the opportunity to request an in-person assessment or other available alternative prior to the telemedicine visit and am voluntarily participating in the telemedicine visit.  I understand that I have the right to withhold or withdraw my consent to the use of telemedicine in the course of my care at any time, without affecting my right to future care  or treatment, and that the Practitioner or I may terminate the telemedicine visit at any time. I understand that I have the right to inspect all information obtained and/or recorded in the course of the telemedicine visit and may receive copies of available information for a reasonable fee.  I  understand that some of the potential risks of receiving the Services via telemedicine include:  [place] Delay or interruption in medical evaluation due to technological equipment failure or disruption; . Information transmitted may not be sufficient (e.g. poor resolution of images) to allow for appropriate medical decision making by the Practitioner; and/or  . In rare instances, security protocols could fail, causing a breach of personal health information.  Furthermore, I acknowledge that it is my responsibility to provide information about my medical history, conditions and care that is complete and accurate to the best of my ability. I acknowledge that Practitioner's advice, recommendations, and/or decision may be based on factors not within their control, such as incomplete or inaccurate data provided by me or distortions of diagnostic images or specimens that may result from electronic transmissions. I understand that the practice of medicine is not an exact science and that Practitioner makes no warranties or guarantees regarding treatment outcomes. I acknowledge that I will receive a copy of this consent concurrently upon execution via email to the email address I last provided but may also request a printed copy by calling the office of [employer].    I understand that my insurance will be billed for this visit.   I have read or had this consent read to me. . I understand the contents of this consent, which adequately explains the benefits and risks of the Services being provided via telemedicine.  . I have been provided ample opportunity to ask questions regarding this consent and the Services and have had my questions answered to my satisfaction. . I give my informed consent for the services to be provided through the use of telemedicine in my medical care  By participating in this telemedicine visit I agree to the above.

## 2018-08-07 ENCOUNTER — Ambulatory Visit (INDEPENDENT_AMBULATORY_CARE_PROVIDER_SITE_OTHER): Payer: Medicare HMO | Admitting: Family Medicine

## 2018-08-07 ENCOUNTER — Other Ambulatory Visit: Payer: Self-pay

## 2018-08-07 ENCOUNTER — Other Ambulatory Visit: Payer: Self-pay | Admitting: Family Medicine

## 2018-08-07 DIAGNOSIS — E7849 Other hyperlipidemia: Secondary | ICD-10-CM | POA: Diagnosis not present

## 2018-08-07 DIAGNOSIS — N400 Enlarged prostate without lower urinary tract symptoms: Secondary | ICD-10-CM

## 2018-08-07 DIAGNOSIS — I10 Essential (primary) hypertension: Secondary | ICD-10-CM

## 2018-08-07 MED ORDER — SIMVASTATIN 20 MG PO TABS: 20.0000 mg | ORAL_TABLET | Freq: Every day | ORAL | 0 refills | Status: DC

## 2018-08-07 MED ORDER — LISINOPRIL-HYDROCHLOROTHIAZIDE 10-12.5 MG PO TABS: 1.0000 | ORAL_TABLET | Freq: Every day | ORAL | 1 refills | Status: DC

## 2018-08-07 NOTE — Progress Notes (Signed)
Patient ID: Chase Hurst, male   DOB: 1926-11-17, 85 y.o.   MRN: [id number]  This visit type was conducted due to national recommendations for restrictions regarding the COVID-19 pandemic in an effort to limit this patient's exposure and mitigate transmission in our community.   Virtual Visit via Telephone Note  I connected with Chase Hurst on 08/07/18 at  4:15 PM EDT by telephone and verified that I am speaking with the correct person using two identifiers.   I discussed the limitations, risks, security and privacy concerns of performing an evaluation and management service by telephone and the availability of in person appointments. I also discussed with the patient that there may be a patient responsible charge related to this service. The patient expressed understanding and agreed to proceed.  Location patient: home Location provider: work or home office Participants present for the call: patient, provider Patient did not have a visit in the prior 7 days to address this/these issue(s).   History of Present Illness: Patient has history of hypertension, aortic stenosis, BPH, hyperlipidemia.  Medications reviewed.  He needs refills of lisinopril and simvastatin.  He is overdue for some labs but is very nervous about coming in for lab work at this point.  He is compliant with all medications.  He had blood pressure earlier today 92/51 and earlier than that 103/71.  Denies any recent orthostatic symptoms.  No lightheadedness.  No dizziness.  No chest pains.  No dyspnea with day-to-day activities.  History of BPH.  He remains on Avodart.  He still has occasional slow stream.  He feels he is adequately emptying bladder.  He has occasional nocturia.  Takes simvastatin for hyperlipidemia.  No myalgias.   Observations/Objective: Patient sounds cheerful and well on the phone. I do not appreciate any SOB. Speech and thought processing are grossly intact. Patient reported  vitals:  Assessment and Plan:  #1 hypertension well controlled. -Monitor blood pressure and be in touch if any dizziness or if blood pressures dropping down below 90 systolic with certainly back off his medications  #2 hyperlipidemia treated with simvastatin.  Overdue for labs -We recommended future labs with lipids and hepatic panel.  Patient would like to wait a few months before getting those checked  #3 BPH-symptomatically stable on Avodart  Follow Up Instructions:  -We discussed trying to get in for 91-month follow-up.   [id number] 5-10 [id number] 11-20 [id number] 21-30 I did not refer this patient for an OV in the next 24 hours for this/these issue(s).  I discussed the assessment and treatment plan with the patient. The patient was provided an opportunity to ask questions and all were answered. The patient agreed with the plan and demonstrated an understanding of the instructions.   The patient was advised to call back or seek an in-person evaluation if the symptoms worsen or if the condition fails to improve as anticipated.  I provided 22 minutes of non-face-to-face time during this encounter.   Carolann Littler, MD

## 2018-08-11 NOTE — Progress Notes (Signed)
Virtual Visit via Telephone Note   This visit type was conducted due to national recommendations for restrictions regarding the COVID-19 Pandemic (e.g. social distancing) in an effort to limit this patient's exposure and mitigate transmission in our community.  Due to his co-morbid illnesses, this patient is at least at moderate risk for complications without adequate follow up.  This format is felt to be most appropriate for this patient at this time.  The patient did not have access to video technology/had technical difficulties with video requiring transitioning to audio format only (telephone).  All issues noted in this document were discussed and addressed.  No physical exam could be performed with this format.  Please refer to the patient's chart for his  consent to telehealth for [facility].   Date:  08/13/2018   ID:  Chase Hurst, DOB 06-02-1926, MRN [id number]  Patient Location: Home Provider Location: Home  PCP:  Chase Post, MD  Cardiologist:  Chase Moores, MD  Electrophysiologist:  None   Evaluation Performed:  Follow-Up Visit  Chief Complaint:  Aortic stenosis  Past notes:  Chase Hurst is a 85 y.o. male with a hx of dyspnea on exertion.   He was seen by Chase Hurst in Sept for DOE and HTN, HLD. Echo in April, 2019 shows normal LV systolic function,  Grade 1 diastolic dysfunction Has mild AS with mean AV gradient of 11 mmHg.  Does not smoke Drinks 2 shots every evening Retired from [employer],   Is a Doctor, general practice, Had a myoview study in Sept. 2019 which showed no ischemia and EF of 63%  Breathing is much better now. Non of his presenting symptoms are present anymore  He thinks his "Lousy Diet" contributed to this  Is eating better now .   His wife is disabled.   He has a helper to help look after her.  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   August 13, 2018    Chase Hurst is a 85 y.o. male with seen for follow up of his mild AS   No CP or dyspnea.   Has mild AS by echo April 2019. Avoids salt,   BP is well controlled,  Even a bit low today - has not eaten breakfast yet this am   [employer]   [employer]   Has some DOE doing exertion - digging Hurst holes Still has his bee hives. Has 100 hives.   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 Wearing masks,       The patient does not have symptoms concerning for COVID-19 infection (fever, chills, cough, or new shortness of breath).    Past Medical History:  Diagnosis Date  . HYPERLIPIDEMIA 02/25/2009  . Hypertension   . HYPERTROPHY PROSTATE W/UR OBST \\T \ OTH LUTS 02/25/2009   Past Surgical History:  Procedure Laterality Date  . HERNIA REPAIR  1935  . LESION REMOVAL Bilateral 08/06/2012   Procedure: EXCISION LEFT LIP AND RIGHT EYE LESION;  Surgeon: Chase Nunnery, MD;  Location: [facility];  Service: ENT;  Laterality: Bilateral;     Current Meds  Medication Sig  . Calcium Carbonate-Vitamin D 600-200 MG-UNIT CAPS Take by mouth daily.    Marland Kitchen dutasteride (AVODART) 0.5 MG capsule Take 1 capsule (0.5 mg total) by mouth daily.  Marland Kitchen lisinopril-hydrochlorothiazide (ZESTORETIC)  10-12.5 MG tablet Take 1 tablet by mouth daily.  . Multiple Vitamins-Minerals (CENTRUM ULTRA MENS) TABS Take 1 tablet by mouth daily.   . Multiple Vitamins-Minerals (PRESERVISION AREDS 2 PO) Take 1 tablet by mouth 2 (two) times a day.  Marland Kitchen OVER THE COUNTER MEDICATION Calcium with zinc  . simvastatin (ZOCOR) 20 MG tablet Take 1 tablet (20 mg total) by mouth at bedtime.  . tadalafil (ADCIRCA/CIALIS) 20 MG tablet Take one every other day as needed for erectile dysfunction.     Allergies:   Patient has no known allergies.   Social History   Tobacco Use  . Smoking status: Never Smoker  . Smokeless tobacco: Never Used  Substance Use Topics  . Alcohol  use: Yes    Comment: 2-3 shots per day- drinks these late evening   . Drug use: No     Family Hx: The patient's family history includes Aneurysm in his father; CAD in his son; Hypertension in his mother; Thyroid disease in his mother.  ROS:   Please see the history of present illness.     All other systems reviewed and are negative.   Prior CV studies:   The following studies were reviewed today:    Labs/Other Tests and Data Reviewed:    EKG:  No ECG reviewed.  Recent Labs: 09/25/2017: BUN 14; Creatinine, Ser 0.95; Potassium 4.1; Sodium 134   Recent Lipid Panel Lab Results  Component Value Date/Time   CHOL 212 (H) 01/10/2017 07:31 AM   TRIG 93.0 01/10/2017 07:31 AM   HDL 93.20 01/10/2017 07:31 AM   CHOLHDL 2 01/10/2017 07:31 AM   LDLCALC 100 (H) 01/10/2017 07:31 AM   LDLDIRECT 93.2 07/23/2012 09:48 AM    Wt Readings from Last 3 Encounters:  08/13/18 132 lb (59.9 kg)  03/18/18 132 lb 12.8 oz (60.2 kg)  03/04/18 125 lb 6.4 oz (56.9 kg)     Objective:    Vital Signs:  BP (!) 93/48 (BP Location: Left Arm, Patient Position: Sitting, Cuff Size: Normal)   Pulse 74   Ht 5\' 3"  (1.6 m)   Wt 132 lb (59.9 kg)   BMI 23.38 kg/m      ASSESSMENT & PLAN:    1. HTN:   Essential HTN.   Blood pressure is actually on the low side today.  I have encouraged him to keep a blood pressure log.  If he continues to get lots of readings below 100 then we will discontinue the lisinopril, HCTZ.  2.  Aortic stenosis: He has mild aortic stenosis.  Continue to follow.  We will repeat his echocardiogram in a year or 2.  COVID-19 Education: The signs and symptoms of COVID-19 were discussed with the patient and how to seek care for testing (follow up with PCP or arrange E-visit).  The importance of social distancing was discussed today.  Time:   Today, I have spent  19  minutes with the patient with telehealth technology discussing the above problems.     Medication Adjustments/Labs and  Tests Ordered: Current medicines are reviewed at length with the patient today.  Concerns regarding medicines are outlined above.   Tests Ordered: No orders of the defined types were placed in this encounter.   Medication Changes: No orders of the defined types were placed in this encounter.   Disposition:  Follow up in 6 month(s)  Signed, Chase Moores, MD  08/13/2018 9:13 AM    [facility]

## 2018-08-13 ENCOUNTER — Telehealth (INDEPENDENT_AMBULATORY_CARE_PROVIDER_SITE_OTHER): Payer: Medicare HMO | Admitting: Cardiovascular Disease

## 2018-08-13 ENCOUNTER — Encounter: Payer: Self-pay | Admitting: Cardiovascular Disease

## 2018-08-13 ENCOUNTER — Other Ambulatory Visit: Payer: Self-pay

## 2018-08-13 VITALS — BP 93/48 | HR 74 | Ht 63.0 in | Wt 132.0 lb

## 2018-08-13 DIAGNOSIS — I350 Nonrheumatic aortic (valve) stenosis: Secondary | ICD-10-CM | POA: Diagnosis not present

## 2018-08-13 DIAGNOSIS — Z7189 Other specified counseling: Secondary | ICD-10-CM

## 2018-08-13 DIAGNOSIS — I10 Essential (primary) hypertension: Secondary | ICD-10-CM | POA: Diagnosis not present

## 2018-08-13 DIAGNOSIS — E7849 Other hyperlipidemia: Secondary | ICD-10-CM

## 2018-08-13 DIAGNOSIS — R29898 Other symptoms and signs involving the musculoskeletal system: Secondary | ICD-10-CM

## 2018-08-13 NOTE — Patient Instructions (Signed)

## 2018-11-12 DIAGNOSIS — H353123 Nonexudative age-related macular degeneration, left eye, advanced atrophic without subfoveal involvement: Secondary | ICD-10-CM | POA: Diagnosis not present

## 2018-11-12 DIAGNOSIS — D3132 Benign neoplasm of left choroid: Secondary | ICD-10-CM | POA: Diagnosis not present

## 2018-11-12 DIAGNOSIS — H353113 Nonexudative age-related macular degeneration, right eye, advanced atrophic without subfoveal involvement: Secondary | ICD-10-CM | POA: Diagnosis not present

## 2018-11-19 ENCOUNTER — Other Ambulatory Visit: Payer: Self-pay | Admitting: Family Medicine

## 2019-02-06 ENCOUNTER — Other Ambulatory Visit: Payer: Self-pay | Admitting: Family Medicine

## 2019-02-11 DIAGNOSIS — Z8249 Family history of ischemic heart disease and other diseases of the circulatory system: Secondary | ICD-10-CM | POA: Diagnosis not present

## 2019-02-11 DIAGNOSIS — E785 Hyperlipidemia, unspecified: Secondary | ICD-10-CM | POA: Diagnosis not present

## 2019-02-11 DIAGNOSIS — Z008 Encounter for other general examination: Secondary | ICD-10-CM | POA: Diagnosis not present

## 2019-02-11 DIAGNOSIS — I10 Essential (primary) hypertension: Secondary | ICD-10-CM | POA: Diagnosis not present

## 2019-02-11 DIAGNOSIS — N529 Male erectile dysfunction, unspecified: Secondary | ICD-10-CM | POA: Diagnosis not present

## 2019-02-11 DIAGNOSIS — N400 Enlarged prostate without lower urinary tract symptoms: Secondary | ICD-10-CM | POA: Diagnosis not present

## 2019-02-11 DIAGNOSIS — R32 Unspecified urinary incontinence: Secondary | ICD-10-CM | POA: Diagnosis not present

## 2019-02-12 ENCOUNTER — Ambulatory Visit: Payer: Medicare HMO | Admitting: Cardiovascular Disease

## 2019-02-14 DIAGNOSIS — L600 Ingrowing nail: Secondary | ICD-10-CM | POA: Diagnosis not present

## 2019-02-14 DIAGNOSIS — M79675 Pain in left toe(s): Secondary | ICD-10-CM | POA: Diagnosis not present

## 2019-02-14 DIAGNOSIS — M79674 Pain in right toe(s): Secondary | ICD-10-CM | POA: Diagnosis not present

## 2019-02-14 DIAGNOSIS — B351 Tinea unguium: Secondary | ICD-10-CM | POA: Diagnosis not present

## 2019-02-17 ENCOUNTER — Other Ambulatory Visit: Payer: Self-pay | Admitting: Family Medicine

## 2019-05-14 DIAGNOSIS — H353123 Nonexudative age-related macular degeneration, left eye, advanced atrophic without subfoveal involvement: Secondary | ICD-10-CM | POA: Diagnosis not present

## 2019-05-14 DIAGNOSIS — Z961 Presence of intraocular lens: Secondary | ICD-10-CM | POA: Diagnosis not present

## 2019-05-14 DIAGNOSIS — D3132 Benign neoplasm of left choroid: Secondary | ICD-10-CM | POA: Diagnosis not present

## 2019-05-14 DIAGNOSIS — H353113 Nonexudative age-related macular degeneration, right eye, advanced atrophic without subfoveal involvement: Secondary | ICD-10-CM | POA: Diagnosis not present

## 2019-09-25 ENCOUNTER — Other Ambulatory Visit: Payer: Self-pay | Admitting: Family Medicine

## 2019-11-11 DIAGNOSIS — Z85828 Personal history of other malignant neoplasm of skin: Secondary | ICD-10-CM | POA: Diagnosis not present

## 2019-11-11 DIAGNOSIS — E785 Hyperlipidemia, unspecified: Secondary | ICD-10-CM | POA: Diagnosis not present

## 2019-11-11 DIAGNOSIS — N400 Enlarged prostate without lower urinary tract symptoms: Secondary | ICD-10-CM | POA: Diagnosis not present

## 2019-11-11 DIAGNOSIS — R269 Unspecified abnormalities of gait and mobility: Secondary | ICD-10-CM | POA: Diagnosis not present

## 2019-12-25 ENCOUNTER — Other Ambulatory Visit: Payer: Self-pay

## 2019-12-25 ENCOUNTER — Telehealth: Payer: Self-pay

## 2019-12-25 MED ORDER — LISINOPRIL-HYDROCHLOROTHIAZIDE 10-12.5 MG PO TABS: 1.0000 | ORAL_TABLET | Freq: Every day | ORAL | 0 refills | Status: AC

## 2019-12-25 NOTE — Telephone Encounter (Signed)
[employer] called in wanting to leave a message stating that patient has been without his medication for months  lisinopril-hydrochlorothiazide (ZESTORETIC) 10-12.5 MG tablet     Please call and advise

## 2019-12-25 NOTE — Telephone Encounter (Signed)
Called and spoke w/ pt  about refill pt hasn't been seen since 08/2018. Made pt patient an Virtual appointment for 01/05/2020 @1045am . Sent refill to [facility] on [address].

## 2019-12-29 ENCOUNTER — Telehealth: Payer: Self-pay | Admitting: *Deleted

## 2019-12-29 NOTE — Telephone Encounter (Signed)
[name] called from [place] stating the patient is unsure if he needs to be taking his lisinopril. Patient also hasn't had the flu shot or the covid vaccine. [facility] is requesting a call back [phone] and can leave a detailed message.

## 2019-12-30 NOTE — Addendum Note (Signed)
Addended by: Karle Barr on: 12/30/2019 09:20 AM   Modules accepted: Orders

## 2019-12-30 NOTE — Telephone Encounter (Signed)
Called Sheryl with [employer] and informed that pt has an upcoming appointment on 01/05/2020 and that medication for lisinopril HCTZ 10-12.5 mg tablet. Additionally, erx was sent on 12/26/2019.   Pt contacted and informed of same.

## 2020-01-05 ENCOUNTER — Telehealth (INDEPENDENT_AMBULATORY_CARE_PROVIDER_SITE_OTHER): Payer: Medicare HMO | Admitting: Family Medicine

## 2020-01-05 ENCOUNTER — Encounter: Payer: Self-pay | Admitting: Family Medicine

## 2020-01-05 VITALS — Ht 63.0 in | Wt 132.0 lb

## 2020-01-05 DIAGNOSIS — I10 Essential (primary) hypertension: Secondary | ICD-10-CM | POA: Diagnosis not present

## 2020-01-05 DIAGNOSIS — E7849 Other hyperlipidemia: Secondary | ICD-10-CM | POA: Diagnosis not present

## 2020-01-05 DIAGNOSIS — N400 Enlarged prostate without lower urinary tract symptoms: Secondary | ICD-10-CM

## 2020-01-05 MED ORDER — SIMVASTATIN 20 MG PO TABS: 20.0000 mg | ORAL_TABLET | Freq: Every day | ORAL | 3 refills | Status: AC

## 2020-01-05 NOTE — Progress Notes (Signed)
Patient ID: Chase Hurst, male   DOB: 06-15-1926, 86 y.o.   MRN: [id number]   Virtual Visit via Telephone Note  I connected with Telford Nab on 01/05/20 at 10:45 AM EDT by telephone and verified that I am speaking with the correct person using two identifiers.   I discussed the limitations, risks, security and privacy concerns of performing an evaluation and management service by telephone and the availability of in person appointments. I also discussed with the patient that there may be a patient responsible charge related to this service. The patient expressed understanding and agreed to proceed.  Location patient: home Location provider: work or home office Participants present for the call: patient, provider Patient did not have a visit in the prior 7 days to address this/these issue(s).   History of Present Illness: Mr. Marquis is seen for medical follow-up.  He has history of BPH, hypertension, hyperlipidemia.  His current medications are Avodart, lisinopril HCTZ, and simvastatin.  Compliant with medications.  No recent labs.  He was supposed to get these last year.  He states his blood pressures been stable.  No recent dizziness.  Blood pressure today 120/78.  Weight 132 pounds.  No recent chest pains.  Remains active with honey beekeeping.  Still goes to the farmers market weekly.  His wife is now in a rehab facility with multiple medical problems.  Mr. Hiney denies any recent falls  Past Medical History:  Diagnosis Date  . HYPERLIPIDEMIA 02/25/2009  . Hypertension   . HYPERTROPHY PROSTATE W/UR OBST \\T \ OTH LUTS 02/25/2009   Past Surgical History:  Procedure Laterality Date  . HERNIA REPAIR  1935  . LESION REMOVAL Bilateral 08/06/2012   Procedure: EXCISION LEFT LIP AND RIGHT EYE LESION;  Surgeon: Rozetta Nunnery, MD;  Location: [facility];  Service: ENT;  Laterality: Bilateral;    reports that he has never smoked. He has never used smokeless tobacco.  He reports current alcohol use. He reports that he does not use drugs. family history includes Aneurysm in his father; CAD in his son; Hypertension in his mother; Thyroid disease in his mother. No Known Allergies    Observations/Objective: Patient sounds cheerful and well on the phone. I do not appreciate any SOB. Speech and thought processing are grossly intact. Patient reported vitals:  Assessment and Plan:  #1 hypertension stable by home readings -Continue lisinopril HCTZ -Check basic metabolic panel  #2 dyslipidemia treated with simvastatin -Check lipid and hepatic panel -Refill simvastatin for 1 year  #3 BPH symptomatically stable on Avodart -Continue Avodart daily  Follow Up Instructions:  -6 months   99441 5-10 99442 11-20 99443 21-30 I did not refer this patient for an OV in the next 24 hours for this/these issue(s).  I discussed the assessment and treatment plan with the patient. The patient was provided an opportunity to ask questions and all were answered. The patient agreed with the plan and demonstrated an understanding of the instructions.   The patient was advised to call back or seek an in-person evaluation if the symptoms worsen or if the condition fails to improve as anticipated.  I provided 22 minutes of non-face-to-face time during this encounter.   Carolann Littler, MD

## 2020-01-07 ENCOUNTER — Other Ambulatory Visit: Payer: Medicare HMO

## 2020-01-07 DIAGNOSIS — E7849 Other hyperlipidemia: Secondary | ICD-10-CM | POA: Diagnosis not present

## 2020-01-07 DIAGNOSIS — I10 Essential (primary) hypertension: Secondary | ICD-10-CM | POA: Diagnosis not present

## 2020-01-08 LAB — HEPATIC FUNCTION PANEL
AG Ratio: 1.5 (calc) (ref 1.0–2.5)
ALT: 8 U/L — ABNORMAL LOW (ref 9–46)
AST: 18 U/L (ref 10–35)
Albumin: 3.9 g/dL (ref 3.6–5.1)
Alkaline phosphatase (APISO): 52 U/L (ref 35–144)
Bilirubin, Direct: 0.2 mg/dL (ref 0.0–0.2)
Globulin: 2.6 g/dL (calc) (ref 1.9–3.7)
Indirect Bilirubin: 0.4 mg/dL (calc) (ref 0.2–1.2)
Total Bilirubin: 0.6 mg/dL (ref 0.2–1.2)
Total Protein: 6.5 g/dL (ref 6.1–8.1)

## 2020-01-08 LAB — BASIC METABOLIC PANEL
BUN: 7 mg/dL (ref 7–25)
CO2: 32 mmol/L (ref 20–32)
Calcium: 9.3 mg/dL (ref 8.6–10.3)
Chloride: 97 mmol/L — ABNORMAL LOW (ref 98–110)
Creat: 1.1 mg/dL (ref 0.70–1.11)
Glucose, Bld: 138 mg/dL — ABNORMAL HIGH (ref 65–99)
Potassium: 3.2 mmol/L — ABNORMAL LOW (ref 3.5–5.3)
Sodium: 137 mmol/L (ref 135–146)

## 2020-01-08 LAB — LIPID PANEL
Cholesterol: 189 mg/dL (ref <200)
HDL: 72 mg/dL (ref >=40)
LDL Cholesterol (Calc): 102 mg/dL (calc) — ABNORMAL HIGH
Non-HDL Cholesterol (Calc): 117 mg/dL (calc) (ref <130)
Total CHOL/HDL Ratio: 2.6 (calc) (ref <5.0)
Triglycerides: 68 mg/dL (ref <150)

## 2020-01-09 ENCOUNTER — Telehealth: Payer: Self-pay | Admitting: Family Medicine

## 2020-01-09 DIAGNOSIS — E876 Hypokalemia: Secondary | ICD-10-CM

## 2020-01-09 NOTE — Telephone Encounter (Signed)
-----   Message from [name], MD sent at 01/08/2020 11:45 AM EDT ----- Labs OK except for low K    increase Potassium rich foods (eg bananas, other fruits, potatoes, white beans)  repeat BMP in 4 weeks.

## 2020-01-09 NOTE — Telephone Encounter (Signed)
Pt aware of lab results and is in agreement with plan/he is scheduled for a lab visit on 12.3.21 at 8:40am for a BMP for Dx Hypokalemia/future orders created/thx dmf

## 2020-02-06 ENCOUNTER — Other Ambulatory Visit (INDEPENDENT_AMBULATORY_CARE_PROVIDER_SITE_OTHER): Payer: Medicare HMO

## 2020-02-06 ENCOUNTER — Other Ambulatory Visit: Payer: Self-pay

## 2020-02-06 DIAGNOSIS — E876 Hypokalemia: Secondary | ICD-10-CM | POA: Diagnosis not present

## 2020-02-06 LAB — BASIC METABOLIC PANEL
BUN: 7 mg/dL (ref 7–25)
CO2: 32 mmol/L (ref 20–32)
Calcium: 9.4 mg/dL (ref 8.6–10.3)
Chloride: 99 mmol/L (ref 98–110)
Creat: 1 mg/dL (ref 0.70–1.11)
Glucose, Bld: 78 mg/dL (ref 65–99)
Potassium: 4.2 mmol/L (ref 3.5–5.3)
Sodium: 140 mmol/L (ref 135–146)

## 2020-02-06 NOTE — Addendum Note (Signed)
Addended by: Marrion Coy on: 02/06/2020 09:03 AM   Modules accepted: Orders

## 2020-02-09 ENCOUNTER — Telehealth: Payer: Self-pay

## 2020-02-09 NOTE — Telephone Encounter (Signed)
-----   Message from [name], MD sent at 02/09/2020  5:34 AM EST ----- Repeat labs normal.  Potassium and glucose now normal.

## 2020-02-09 NOTE — Telephone Encounter (Signed)
Patient aware of results.

## 2020-02-24 NOTE — Progress Notes (Unsigned)
Subjective:   Chase Hurst is a 86 y.o. male who presents for Medicare Annual/Subsequent preventive examination.  Review of Systems    N/A       Objective:    There were no vitals filed for this visit. There is no height or weight on file to calculate BMI.  Advanced Directives 01/01/2018  Does Patient Have a Medical Advance Directive? Yes    Current Medications (verified) Outpatient Encounter Medications as of 02/25/2020  Medication Sig  . Calcium Carbonate-Vitamin D 600-200 MG-UNIT CAPS Take by mouth daily.   (Patient not taking: Reported on 01/05/2020)  . dutasteride (AVODART) 0.5 MG capsule TAKE ONE CAPSULE BY MOUTH DAILY  . lisinopril-hydrochlorothiazide (ZESTORETIC) 10-12.5 MG tablet Take 1 tablet by mouth daily.  . Multiple Vitamins-Minerals (CENTRUM ULTRA MENS) TABS Take 1 tablet by mouth daily.  (Patient not taking: Reported on 01/05/2020)  . Multiple Vitamins-Minerals (PRESERVISION AREDS 2 PO) Take 1 tablet by mouth 2 (two) times a day. (Patient not taking: Reported on 01/05/2020)  . OVER THE COUNTER MEDICATION Calcium with zinc (Patient not taking: Reported on 01/05/2020)  . simvastatin (ZOCOR) 20 MG tablet Take 1 tablet (20 mg total) by mouth at bedtime.   No facility-administered encounter medications on file as of 02/25/2020.    Allergies (verified) Patient has no known allergies.   History: Past Medical History:  Diagnosis Date  . HYPERLIPIDEMIA 02/25/2009  . Hypertension   . HYPERTROPHY PROSTATE W/UR OBST \\T \ OTH LUTS 02/25/2009   Past Surgical History:  Procedure Laterality Date  . HERNIA REPAIR  1935  . LESION REMOVAL Bilateral 08/06/2012   Procedure: EXCISION LEFT LIP AND RIGHT EYE LESION;  Surgeon: Chase Halon, MD;  Location: [facility];  Service: ENT;  Laterality: Bilateral;   Family History  Problem Relation Age of Onset  . Hypertension Mother   . Thyroid disease Mother   . Aneurysm Father   . CAD Son        died of  MI at age 70   Social History   Socioeconomic History  . Marital status: Married    Spouse name: Not on file  . Number of children: Not on file  . Years of education: Not on file  . Highest education level: Not on file  Occupational History  . Not on file  Tobacco Use  . Smoking status: Never Smoker  . Smokeless tobacco: Never Used  Vaping Use  . Vaping Use: Never used  Substance and Sexual Activity  . Alcohol use: Yes    Comment: 2-3 shots per day- drinks these late evening   . Drug use: No  . Sexual activity: Never  Other Topics Concern  . Not on file  Social History Narrative  . Not on file   Social Determinants of Health   Financial Resource Strain: Not on file  Food Insecurity: Not on file  Transportation Needs: Not on file  Physical Activity: Not on file  Stress: Not on file  Social Connections: Not on file    Tobacco Counseling Counseling given: Not Answered   Clinical Intake:                 Diabetic?No          Activities of Daily Living No flowsheet data found.  Patient Care Team: Chase Covey, MD as PCP - General Nahser, Deloris Ping, MD as PCP - Cardiology (Cardiology)  Indicate any recent Medical Services you may have received from other than  [facility] providers in the past year (date may be approximate).     Assessment:   This is a routine wellness examination for Chase Hurst.  Hearing/Vision screen No exam data present  Dietary issues and exercise activities discussed:    Goals    . Patient Stated     Keep going and taking care of wife       Depression Screen PHQ 2/9 Scores 01/01/2018 01/10/2017 12/06/2015 08/28/2014 02/13/2013 07/23/2012  PHQ - 2 Score 0 0 0 0 0 0    Fall Risk Fall Risk  01/01/2018 01/10/2017 12/06/2015 08/28/2014 02/13/2013  Falls in the past year? No No No No No    FALL RISK PREVENTION PERTAINING TO THE HOME:  Any stairs in or around the home? {YES/NO:21197} If so, are there any without handrails? No   Home free of loose throw rugs in walkways, pet beds, electrical cords, etc? Yes  Adequate lighting in your home to reduce risk of falls? Yes   ASSISTIVE DEVICES UTILIZED TO PREVENT FALLS:  Life alert? {YES/NO:21197} Use of a cane, walker or w/c? {YES/NO:21197} Grab bars in the bathroom? {YES/NO:21197} Shower chair or bench in shower? {YES/NO:21197} Elevated toilet seat or a handicapped toilet? {YES/NO:21197}  TIMED UP AND GO:  Was the test performed? {YES/NO:21197}.  Length of time to ambulate 10 feet: *** sec.   {Appearance of [id number]  Cognitive Function: MMSE - Mini Mental State Exam 01/01/2018  Not completed: (No Data)        Immunizations Immunization History  Administered Date(s) Administered  . Influenza, High Dose Seasonal PF 12/06/2015, 01/10/2017, 01/01/2018  . Influenza,inj,Quad PF,6+ Mos 02/13/2013, 02/09/2014  . Pneumococcal Conjugate-13 02/09/2014  . Pneumococcal Polysaccharide-23 07/19/2010  . Td 03/07/2007  . Zoster 08/08/2010    TDAP status: Due, Education has been provided regarding the importance of this vaccine. Advised may receive this vaccine at local pharmacy or Health Dept. Aware to provide a copy of the vaccination record if obtained from local pharmacy or Health Dept. Verbalized acceptance and understanding.  {Flu Vaccine status:2101806}  Pneumococcal vaccine status: Up to date  {Covid-19 vaccine status:2101808}  Qualifies for Shingles Vaccine? Yes   Zostavax completed Yes   Shingrix Completed?: No.    Education has been provided regarding the importance of this vaccine. Patient has been advised to call insurance company to determine out of pocket expense if they have not yet received this vaccine. Advised may also receive vaccine at local pharmacy or Health Dept. Verbalized acceptance and understanding.  Screening Tests Health Maintenance  Topic Date Due  . COVID-19 Vaccine (1) Never done  . TETANUS/TDAP  03/06/2017  . INFLUENZA  VACCINE  10/05/2019  . PNA vac Low Risk Adult  Completed    Health Maintenance  Health Maintenance Due  Topic Date Due  . COVID-19 Vaccine (1) Never done  . TETANUS/TDAP  03/06/2017  . INFLUENZA VACCINE  10/05/2019    Colorectal cancer screening: No longer required.   Lung Cancer Screening: (Low Dose CT Chest recommended if Age 23-80 years, 30 pack-year currently smoking OR have quit w/in 15years.) does not qualify.   Lung Cancer Screening Referral: N/A   Additional Screening:  Hepatitis C Screening: does not qualify;   Vision Screening: Recommended annual ophthalmology exams for early detection of glaucoma and other disorders of the eye. Is the patient up to date with their annual eye exam?  {YES/NO:21197} Who is the provider or what is the name of the office in which the patient attends annual eye exams? ***  If pt is not established with a provider, would they like to be referred to a provider to establish care? {YES/NO:21197}.   Dental Screening: Recommended annual dental exams for proper oral hygiene  Community Resource Referral / Chronic Care Management: CRR required this visit?  No   CCM required this visit?  No      Plan:     I have personally reviewed and noted the following in the patient's chart:   . Medical and social history . Use of alcohol, tobacco or illicit drugs  . Current medications and supplements . Functional ability and status . Nutritional status . Physical activity . Advanced directives . List of other physicians . Hospitalizations, surgeries, and ER visits in previous 12 months . Vitals . Screenings to include cognitive, depression, and falls . Referrals and appointments  In addition, I have reviewed and discussed with patient certain preventive protocols, quality metrics, and best practice recommendations. A written personalized care plan for preventive services as well as general preventive health recommendations were provided to  patient.     Ofilia Neas, LPN   02/22/7587   Nurse Notes: None

## 2020-02-25 ENCOUNTER — Ambulatory Visit: Payer: Medicare HMO

## 2020-05-07 NOTE — Progress Notes (Signed)
Subjective:   Chase Hurst is a 87 y.o. male who presents for Medicare Annual/Subsequent preventive examination.  I connected with Chase Hurst today by telephone and verified that I am speaking with the correct person using two identifiers. Location patient: home Location provider: work Persons participating in the virtual visit: patient, provider.   I discussed the limitations, risks, security and privacy concerns of performing an evaluation and management service by telephone and the availability of in person appointments. I also discussed with the patient that there may be a patient responsible charge related to this service. The patient expressed understanding and verbally consented to this telephonic visit.    Interactive audio and video telecommunications were attempted between this provider and patient, however failed, due to patient having technical difficulties OR patient did not have access to video capability.  We continued and completed visit with audio only.      Review of Systems    N/A  Cardiac Risk Factors include: male gender;advanced age (>74men, >55 women);hypertension;dyslipidemia     Objective:    Today's Vitals   There is no height or weight on file to calculate BMI.  Advanced Directives 05/11/2020 01/01/2018  Does Patient Have a Medical Advance Directive? Yes Yes  Type of Paramedic of [place];Living will -  Does patient want to make changes to medical advance directive? No - Patient declined -  Copy of [place] in Chart? No - copy requested -    Current Medications (verified) Outpatient Encounter Medications as of 05/11/2020  Medication Sig  . dutasteride (AVODART) 0.5 MG capsule TAKE ONE CAPSULE BY MOUTH DAILY  . lisinopril-hydrochlorothiazide (ZESTORETIC) 10-12.5 MG tablet Take 1 tablet by mouth daily.  . simvastatin (ZOCOR) 20 MG tablet Take 1 tablet (20 mg total) by mouth at bedtime.  . Calcium  Carbonate-Vitamin D 600-200 MG-UNIT CAPS Take by mouth daily.   (Patient not taking: Reported on 01/05/2020)  . Multiple Vitamins-Minerals (CENTRUM ULTRA MENS) TABS Take 1 tablet by mouth daily.  (Patient not taking: No sig reported)  . Multiple Vitamins-Minerals (PRESERVISION AREDS 2 PO) Take 1 tablet by mouth 2 (two) times a day. (Patient not taking: No sig reported)  . OVER THE COUNTER MEDICATION Calcium with zinc (Patient not taking: No sig reported)   No facility-administered encounter medications on file as of 05/11/2020.    Allergies (verified) Patient has no known allergies.   History: Past Medical History:  Diagnosis Date  . HYPERLIPIDEMIA 02/25/2009  . Hypertension   . HYPERTROPHY PROSTATE W/UR OBST \\T \ OTH LUTS 02/25/2009   Past Surgical History:  Procedure Laterality Date  . HERNIA REPAIR  1935  . LESION REMOVAL Bilateral 08/06/2012   Procedure: EXCISION LEFT LIP AND RIGHT EYE LESION;  Surgeon: Rozetta Nunnery, MD;  Location: [facility];  Service: ENT;  Laterality: Bilateral;   Family History  Problem Relation Age of Onset  . Hypertension Mother   . Thyroid disease Mother   . Aneurysm Father   . CAD Son        died of MI at age 32   Social History   Socioeconomic History  . Marital status: Married    Spouse name: Not on file  . Number of children: Not on file  . Years of education: Not on file  . Highest education level: Not on file  Occupational History  . Not on file  Tobacco Use  . Smoking status: Never Smoker  . Smokeless tobacco: Never  Used  Vaping Use  . Vaping Use: Never used  Substance and Sexual Activity  . Alcohol use: Yes    Comment: 2-3 shots per day- drinks these late evening   . Drug use: No  . Sexual activity: Never  Other Topics Concern  . Not on file  Social History Narrative  . Not on file   Social Determinants of Health   Financial Resource Strain: Low Risk   . Difficulty of Paying Living Expenses: Not hard at  all  Food Insecurity: No Food Insecurity  . Worried About Charity fundraiser in the Last Year: Never true  . Ran Out of Food in the Last Year: Never true  Transportation Needs: No Transportation Needs  . Lack of Transportation (Medical): No  . Lack of Transportation (Non-Medical): No  Physical Activity: Inactive  . Days of Exercise per Week: 0 days  . Minutes of Exercise per Session: 0 min  Stress: No Stress Concern Present  . Feeling of Stress : Not at all  Social Connections: Moderately Integrated  . Frequency of Communication with Friends and Family: Twice a week  . Frequency of Social Gatherings with Friends and Family: Twice a week  . Attends Religious Services: Never  . Active Member of Clubs or Organizations: Yes  . Attends Archivist Meetings: More than 4 times per year  . Marital Status: Married    Tobacco Counseling Counseling given: Not Answered   Clinical Intake:  Pre-visit preparation completed: Yes  Pain : No/denies pain     Nutritional Risks: None Diabetes: No  How often do you need to have someone help you when you read instructions, pamphlets, or other written materials from your doctor or pharmacy?: 1 - Never  Diabetic?No   Interpreter Needed?: No  Information entered by :: [place] of Daily Living In your present state of health, do you have any difficulty performing the following activities: 05/11/2020  Hearing? N  Vision? N  Difficulty concentrating or making decisions? N  Walking or climbing stairs? N  Dressing or bathing? N  Doing errands, shopping? N  Preparing Food and eating ? N  Using the Toilet? N  In the past six months, have you accidently leaked urine? N  Do you have problems with loss of bowel control? N  Managing your Medications? N  Managing your Finances? N  Housekeeping or managing your Housekeeping? N  Some recent data might be hidden    Patient Care Team: [name], MD as PCP -  General Nahser, Wonda Cheng, MD as PCP - Cardiology (Cardiology)  Indicate any recent Medical Services you may have received from other than [facility] providers in the past year (date may be approximate).     Assessment:   This is a routine wellness examination for [name].  Hearing/Vision screen  Hearing Screening   125Hz  250Hz  500Hz  1000Hz  2000Hz  3000Hz  4000Hz  6000Hz  8000Hz   Right ear:           Left ear:           Vision Screening Comments: Has not been to the eye doctor in awhile   Dietary issues and exercise activities discussed: Current Exercise Habits: The patient does not participate in regular exercise at present, Exercise limited by: None identified  Goals    . Patient Stated     Keep going and taking care of wife       Depression Screen [facility] 2/9 Scores 05/11/2020 01/01/2018 01/10/2017 12/06/2015 08/28/2014 02/13/2013  07/23/2012  PHQ - 2 Score 0 0 0 0 0 0 0    Fall Risk Fall Risk  05/11/2020 01/01/2018 01/10/2017 12/06/2015 08/28/2014  Falls in the past year? 0 No No No No  Number falls in past yr: 0 - - - -  Injury with Fall? 0 - - - -  Risk for fall due to : No Fall Risks - - - -  Follow up Falls evaluation completed;Falls prevention discussed - - - -    FALL RISK PREVENTION PERTAINING TO THE HOME:  Any stairs in or around the home? Yes  If so, are there any without handrails? No  Home free of loose throw rugs in walkways, pet beds, electrical cords, etc? Yes  Adequate lighting in your home to reduce risk of falls? Yes   ASSISTIVE DEVICES UTILIZED TO PREVENT FALLS:  Life alert? No  Use of a cane, walker or w/c? No  Grab bars in the bathroom? Yes  Shower chair or bench in shower? Yes  Elevated toilet seat or a handicapped toilet? Yes    Cognitive Function: Normal cognitive status assessed by direct observation by this Nurse Health Advisor. No abnormalities found.   MMSE - Mini Mental State Exam 01/01/2018  Not completed: (No Data)         Immunizations Immunization History  Administered Date(s) Administered  . Influenza, High Dose Seasonal PF 12/06/2015, 01/10/2017, 01/01/2018  . Influenza,inj,Quad PF,6+ Mos 02/13/2013, 02/09/2014  . Pneumococcal Conjugate-13 02/09/2014  . Pneumococcal Polysaccharide-23 07/19/2010  . Td 03/07/2007  . Zoster 08/08/2010    TDAP status: Due, Education has been provided regarding the importance of this vaccine. Advised may receive this vaccine at local pharmacy or Health Dept. Aware to provide a copy of the vaccination record if obtained from local pharmacy or Health Dept. Verbalized acceptance and understanding.  Flu Vaccine status: Declined, Education has been provided regarding the importance of this vaccine but patient still declined. Advised may receive this vaccine at local pharmacy or Health Dept. Aware to provide a copy of the vaccination record if obtained from local pharmacy or Health Dept. Verbalized acceptance and understanding.  Pneumococcal vaccine status: Up to date  Covid-19 vaccine status: Declined, Education has been provided regarding the importance of this vaccine but patient still declined. Advised may receive this vaccine at local pharmacy or Health Dept.or vaccine clinic. Aware to provide a copy of the vaccination record if obtained from local pharmacy or Health Dept. Verbalized acceptance and understanding.  Qualifies for Shingles Vaccine? Yes   Zostavax completed No   Shingrix Completed?: No.    Education has been provided regarding the importance of this vaccine. Patient has been advised to call insurance company to determine out of pocket expense if they have not yet received this vaccine. Advised may also receive vaccine at local pharmacy or Health Dept. Verbalized acceptance and understanding.  Screening Tests Health Maintenance  Topic Date Due  . COVID-19 Vaccine (1) Never done  . TETANUS/TDAP  03/06/2017  . INFLUENZA VACCINE  10/05/2019  . PNA vac Low Risk  Adult  Completed  . HPV VACCINES  Aged Out    Health Maintenance  Health Maintenance Due  Topic Date Due  . COVID-19 Vaccine (1) Never done  . TETANUS/TDAP  03/06/2017  . INFLUENZA VACCINE  10/05/2019    Colorectal cancer screening: No longer required.   Lung Cancer Screening: (Low Dose CT Chest recommended if Age 45-80 years, 30 pack-year currently smoking OR have quit w/in 15years.) does  not qualify.   Lung Cancer Screening Referral: N/A   Additional Screening:  Hepatitis C Screening: does not qualify;   Vision Screening: Recommended annual ophthalmology exams for early detection of glaucoma and other disorders of the eye. Is the patient up to date with their annual eye exam?  No  Who is the provider or what is the name of the office in which the patient attends annual eye exams? Dr. Clydene Laming  If pt is not established with a provider, would they like to be referred to a provider to establish care? No .   Dental Screening: Recommended annual dental exams for proper oral hygiene  Community Resource Referral / Chronic Care Management: CRR required this visit?  No   CCM required this visit?  No      Plan:     I have personally reviewed and noted the following in the patient's chart:   . Medical and social history . Use of alcohol, tobacco or illicit drugs  . Current medications and supplements . Functional ability and status . Nutritional status . Physical activity . Advanced directives . List of other physicians . Hospitalizations, surgeries, and ER visits in previous 12 months . Vitals . Screenings to include cognitive, depression, and falls . Referrals and appointments  In addition, I have reviewed and discussed with patient certain preventive protocols, quality metrics, and best practice recommendations. A written personalized care plan for preventive services as well as general preventive health recommendations were provided to patient.     Ofilia Neas,  LPN   06/11/5460   Nurse Notes: None

## 2020-05-11 ENCOUNTER — Ambulatory Visit (INDEPENDENT_AMBULATORY_CARE_PROVIDER_SITE_OTHER): Payer: Medicare HMO

## 2020-05-11 ENCOUNTER — Other Ambulatory Visit: Payer: Self-pay

## 2020-05-11 DIAGNOSIS — Z0000 Encounter for general adult medical examination without abnormal findings: Secondary | ICD-10-CM

## 2020-05-11 NOTE — Patient Instructions (Signed)
Chase Hurst , Thank you for taking time to come for your Medicare Wellness Visit. I appreciate your ongoing commitment to your health goals. Please review the following plan we discussed and let me know if I can assist you in the future.   Screening recommendations/referrals: Colonoscopy: No longer required  Recommended yearly ophthalmology/optometry visit for glaucoma screening and checkup Recommended yearly dental visit for hygiene and checkup  Vaccinations: Influenza vaccine: Patient required  Pneumococcal vaccine: Completed series  Tdap vaccine: Currently due, if you would like to receive we recommend that you do so at your local pharmacy or you may await and injury to receive  Shingles vaccine: Currently due for Shingrix, if you would like to receive we recommend that you do so at your local pharmacy as it is less expensive     Advanced directives: Please bring in copies of your advanced medical directives so that we may scan them into your chart.   Conditions/risks identified: None   Next appointment: None   Preventive Care 65 Years and Older, Male Preventive care refers to lifestyle choices and visits with your health care provider that can promote health and wellness. What does preventive care include?  A yearly physical exam. This is also called an annual well check.  Dental exams once or twice a year.  Routine eye exams. Ask your health care provider how often you should have your eyes checked.  Personal lifestyle choices, including:  Daily care of your teeth and gums.  Regular physical activity.  Eating a healthy diet.  Avoiding tobacco and drug use.  Limiting alcohol use.  Practicing safe sex.  Taking low doses of aspirin every day.  Taking vitamin and mineral supplements as recommended by your health care provider. What happens during an annual well check? The services and screenings done by your health care provider during your annual well check will  depend on your age, overall health, lifestyle risk factors, and family history of disease. Counseling  Your health care provider may ask you questions about your:  Alcohol use.  Tobacco use.  Drug use.  Emotional well-being.  Home and relationship well-being.  Sexual activity.  Eating habits.  History of falls.  Memory and ability to understand (cognition).  Work and work Statistician. Screening  You may have the following tests or measurements:  Height, weight, and BMI.  Blood pressure.  Lipid and cholesterol levels. These may be checked every 5 years, or more frequently if you are over 6 years old.  Skin check.  Lung cancer screening. You may have this screening every year starting at age 24 if you have a 30-pack-year history of smoking and currently smoke or have quit within the past 15 years.  Fecal occult blood test (FOBT) of the stool. You may have this test every year starting at age 78.  Flexible sigmoidoscopy or colonoscopy. You may have a sigmoidoscopy every 5 years or a colonoscopy every 10 years starting at age 31.  Prostate cancer screening. Recommendations will vary depending on your family history and other risks.  Hepatitis C blood test.  Hepatitis B blood test.  Sexually transmitted disease (STD) testing.  Diabetes screening. This is done by checking your blood sugar (glucose) after you have not eaten for a while (fasting). You may have this done every 1-3 years.  Abdominal aortic aneurysm (AAA) screening. You may need this if you are a current or former smoker.  Osteoporosis. You may be screened starting at age 74 if you are at  high risk. Talk with your health care provider about your test results, treatment options, and if necessary, the need for more tests. Vaccines  Your health care provider may recommend certain vaccines, such as:  Influenza vaccine. This is recommended every year.  Tetanus, diphtheria, and acellular pertussis (Tdap,  Td) vaccine. You may need a Td booster every 10 years.  Zoster vaccine. You may need this after age 61.  Pneumococcal 13-valent conjugate (PCV13) vaccine. One dose is recommended after age 10.  Pneumococcal polysaccharide (PPSV23) vaccine. One dose is recommended after age 32. Talk to your health care provider about which screenings and vaccines you need and how often you need them. This information is not intended to replace advice given to you by your health care provider. Make sure you discuss any questions you have with your health care provider. Document Released: 03/19/2015 Document Revised: 11/10/2015 Document Reviewed: 12/22/2014 Elsevier Interactive Patient Education  2017 [place] Prevention in the Home Falls can cause injuries. They can happen to people of all ages. There are many things you can do to make your home safe and to help prevent falls. What can I do on the outside of my home?  Regularly fix the edges of walkways and driveways and fix any cracks.  Remove anything that might make you trip as you walk through a door, such as a raised step or threshold.  Trim any bushes or trees on the path to your home.  Use bright outdoor lighting.  Clear any walking paths of anything that might make someone trip, such as rocks or tools.  Regularly check to see if handrails are loose or broken. Make sure that both sides of any steps have handrails.  Any raised decks and porches should have guardrails on the edges.  Have any leaves, snow, or ice cleared regularly.  Use sand or salt on walking paths during winter.  Clean up any spills in your garage right away. This includes oil or grease spills. What can I do in the bathroom?  Use night lights.  Install grab bars by the toilet and in the tub and shower. Do not use towel bars as grab bars.  Use non-skid mats or decals in the tub or shower.  If you need to sit down in the shower, use a plastic, non-slip  stool.  Keep the floor dry. Clean up any water that spills on the floor as soon as it happens.  Remove soap buildup in the tub or shower regularly.  Attach bath mats securely with double-sided non-slip rug tape.  Do not have throw rugs and other things on the floor that can make you trip. What can I do in the bedroom?  Use night lights.  Make sure that you have a light by your bed that is easy to reach.  Do not use any sheets or blankets that are too big for your bed. They should not hang down onto the floor.  Have a firm chair that has side arms. You can use this for support while you get dressed.  Do not have throw rugs and other things on the floor that can make you trip. What can I do in the kitchen?  Clean up any spills right away.  Avoid walking on wet floors.  Keep items that you use a lot in easy-to-reach places.  If you need to reach something above you, use a strong step stool that has a grab bar.  Keep electrical cords out of the  way.  Do not use floor polish or wax that makes floors slippery. If you must use wax, use non-skid floor wax.  Do not have throw rugs and other things on the floor that can make you trip. What can I do with my stairs?  Do not leave any items on the stairs.  Make sure that there are handrails on both sides of the stairs and use them. Fix handrails that are broken or loose. Make sure that handrails are as long as the stairways.  Check any carpeting to make sure that it is firmly attached to the stairs. Fix any carpet that is loose or worn.  Avoid having throw rugs at the top or bottom of the stairs. If you do have throw rugs, attach them to the floor with carpet tape.  Make sure that you have a light switch at the top of the stairs and the bottom of the stairs. If you do not have them, ask someone to add them for you. What else can I do to help prevent falls?  Wear shoes that:  Do not have high heels.  Have rubber bottoms.  Are  comfortable and fit you well.  Are closed at the toe. Do not wear sandals.  If you use a stepladder:  Make sure that it is fully opened. Do not climb a closed stepladder.  Make sure that both sides of the stepladder are locked into place.  Ask someone to hold it for you, if possible.  Clearly mark and make sure that you can see:  Any grab bars or handrails.  First and last steps.  Where the edge of each step is.  Use tools that help you move around (mobility aids) if they are needed. These include:  Canes.  Walkers.  Scooters.  Crutches.  Turn on the lights when you go into a dark area. Replace any light bulbs as soon as they burn out.  Set up your furniture so you have a clear path. Avoid moving your furniture around.  If any of your floors are uneven, fix them.  If there are any pets around you, be aware of where they are.  Review your medicines with your doctor. Some medicines can make you feel dizzy. This can increase your chance of falling. Ask your doctor what other things that you can do to help prevent falls. This information is not intended to replace advice given to you by your health care provider. Make sure you discuss any questions you have with your health care provider. Document Released: 12/17/2008 Document Revised: 07/29/2015 Document Reviewed: 03/27/2014 Elsevier Interactive Patient Education  2017 [employer].

## 2020-06-02 DIAGNOSIS — L600 Ingrowing nail: Secondary | ICD-10-CM | POA: Diagnosis not present

## 2020-06-02 DIAGNOSIS — M79674 Pain in right toe(s): Secondary | ICD-10-CM | POA: Diagnosis not present

## 2020-06-02 DIAGNOSIS — B351 Tinea unguium: Secondary | ICD-10-CM | POA: Diagnosis not present

## 2020-06-02 DIAGNOSIS — M79675 Pain in left toe(s): Secondary | ICD-10-CM | POA: Diagnosis not present

## 2020-06-04 DIAGNOSIS — Z8249 Family history of ischemic heart disease and other diseases of the circulatory system: Secondary | ICD-10-CM | POA: Diagnosis not present

## 2020-06-04 DIAGNOSIS — R269 Unspecified abnormalities of gait and mobility: Secondary | ICD-10-CM | POA: Diagnosis not present

## 2020-06-04 DIAGNOSIS — N529 Male erectile dysfunction, unspecified: Secondary | ICD-10-CM | POA: Diagnosis not present

## 2020-06-04 DIAGNOSIS — E785 Hyperlipidemia, unspecified: Secondary | ICD-10-CM | POA: Diagnosis not present

## 2020-06-04 DIAGNOSIS — N400 Enlarged prostate without lower urinary tract symptoms: Secondary | ICD-10-CM | POA: Diagnosis not present

## 2020-06-04 DIAGNOSIS — Z008 Encounter for other general examination: Secondary | ICD-10-CM | POA: Diagnosis not present

## 2020-06-04 DIAGNOSIS — I10 Essential (primary) hypertension: Secondary | ICD-10-CM | POA: Diagnosis not present

## 2020-06-04 DIAGNOSIS — Z85828 Personal history of other malignant neoplasm of skin: Secondary | ICD-10-CM | POA: Diagnosis not present

## 2020-06-04 DIAGNOSIS — R69 Illness, unspecified: Secondary | ICD-10-CM | POA: Diagnosis not present

## 2020-12-18 IMAGING — DX DG KNEE COMPLETE 4+V*R*
4 series · 4 of 4 positions shown · non-contrast
Comparison: None.

CLINICAL DATA: Right knee pain and swelling.  No known injury.

EXAM:
RIGHT KNEE - COMPLETE 4+ VIEW

[knee ap (1 of 3)]
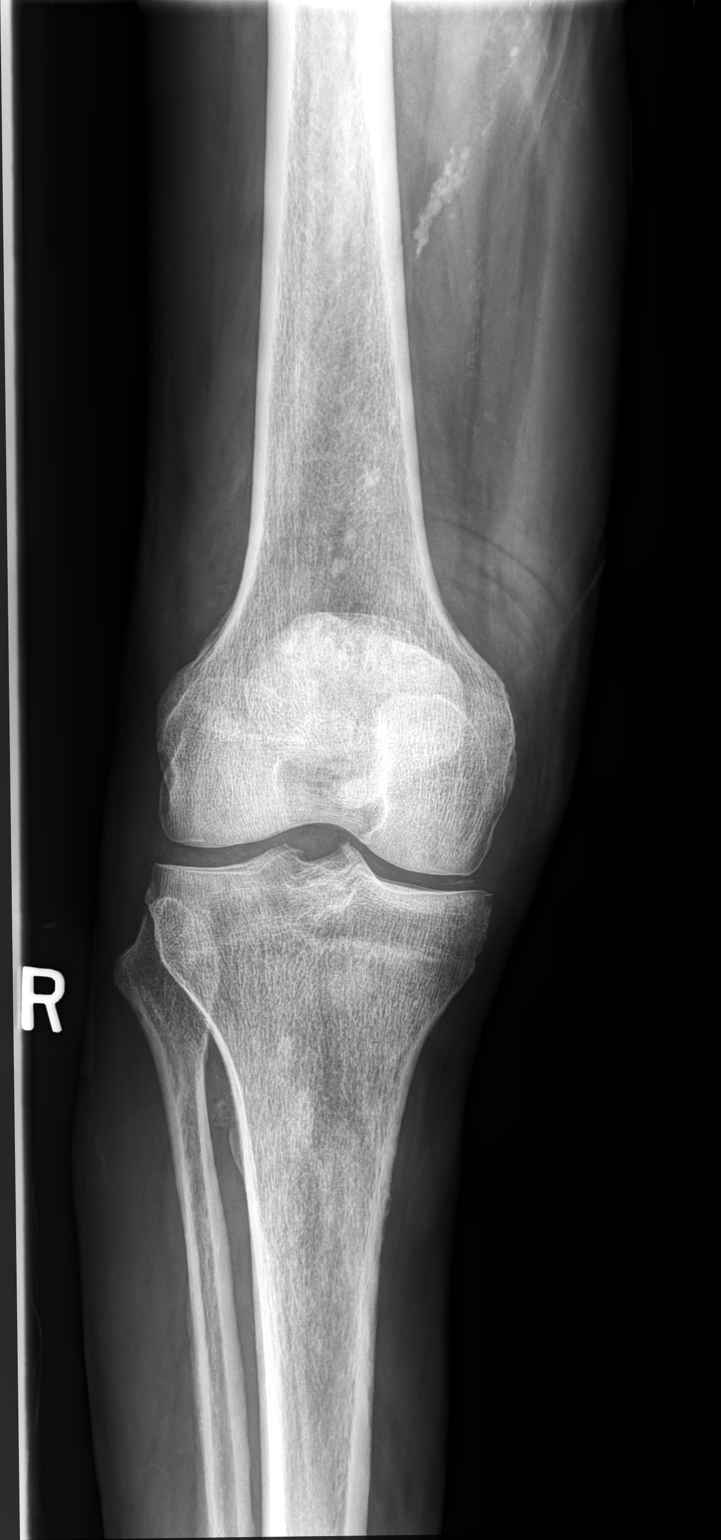

[knee ap (2 of 3)]
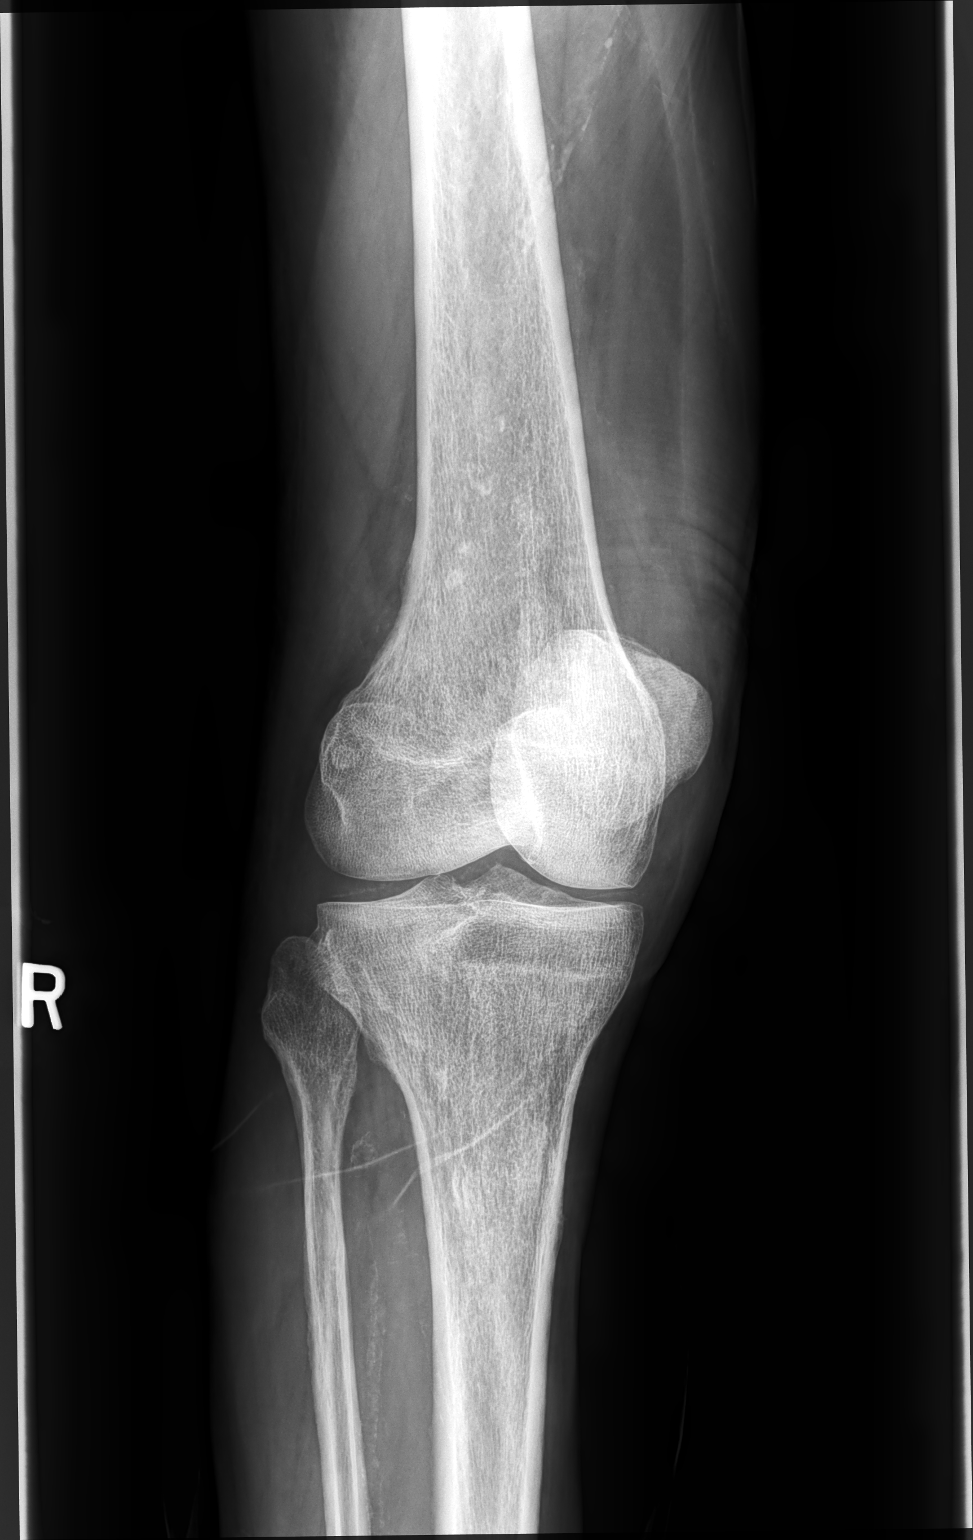

[knee ap (3 of 3)]
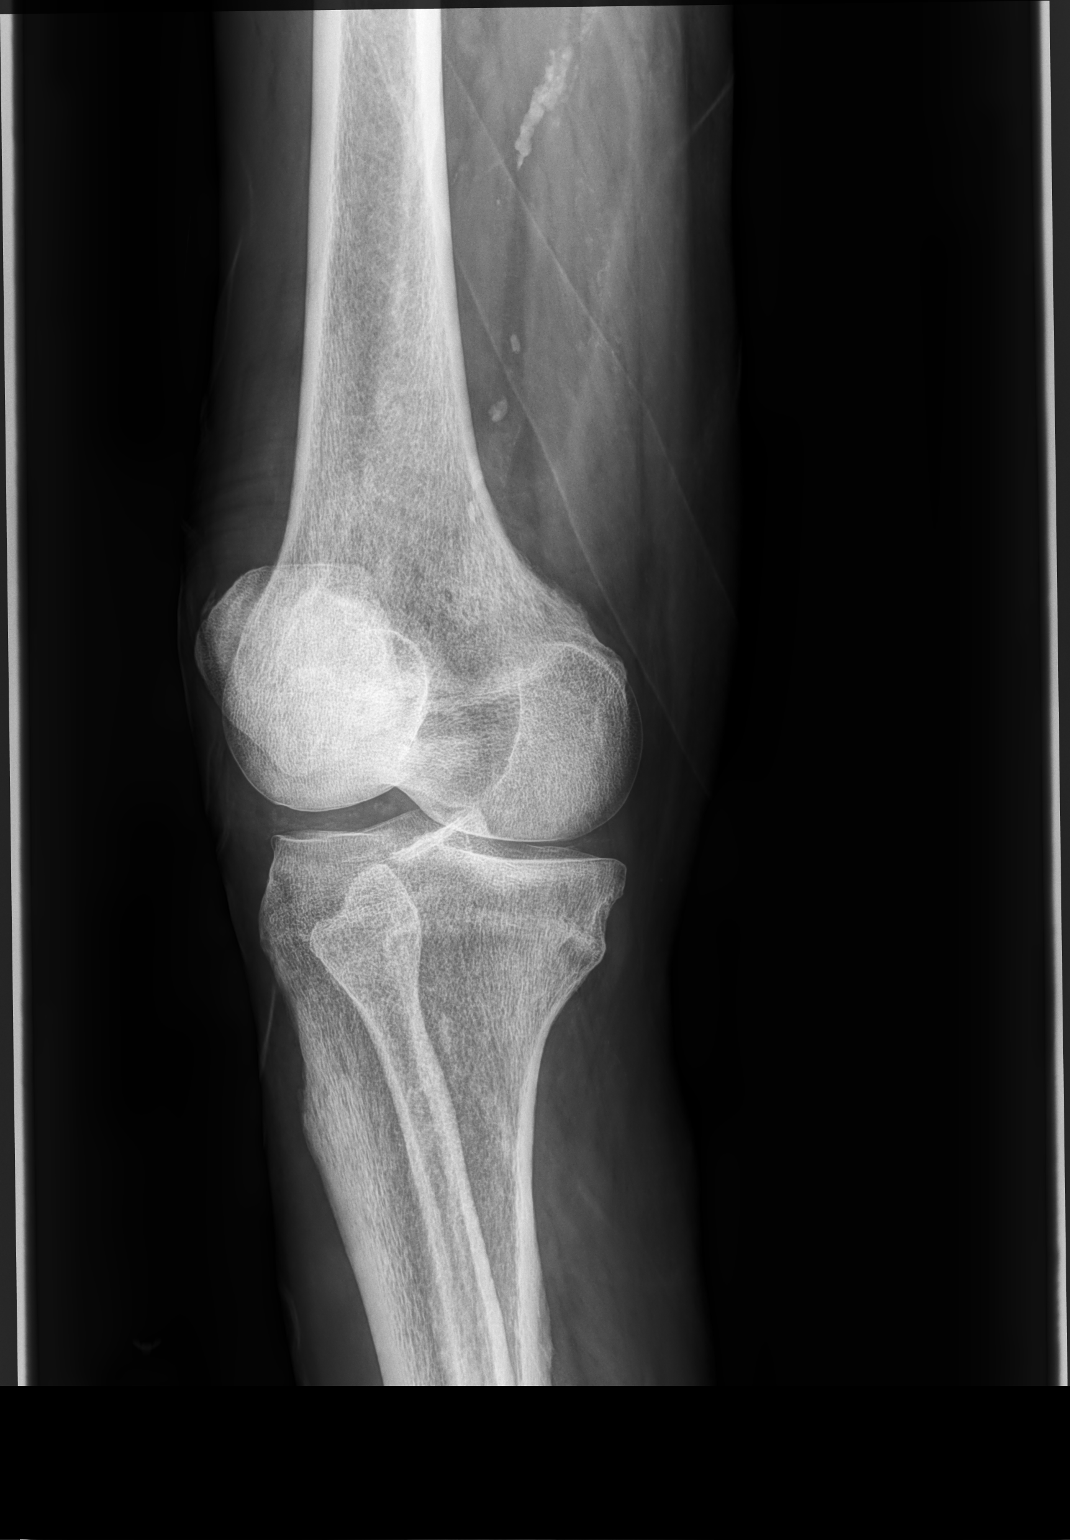

[knee lat]
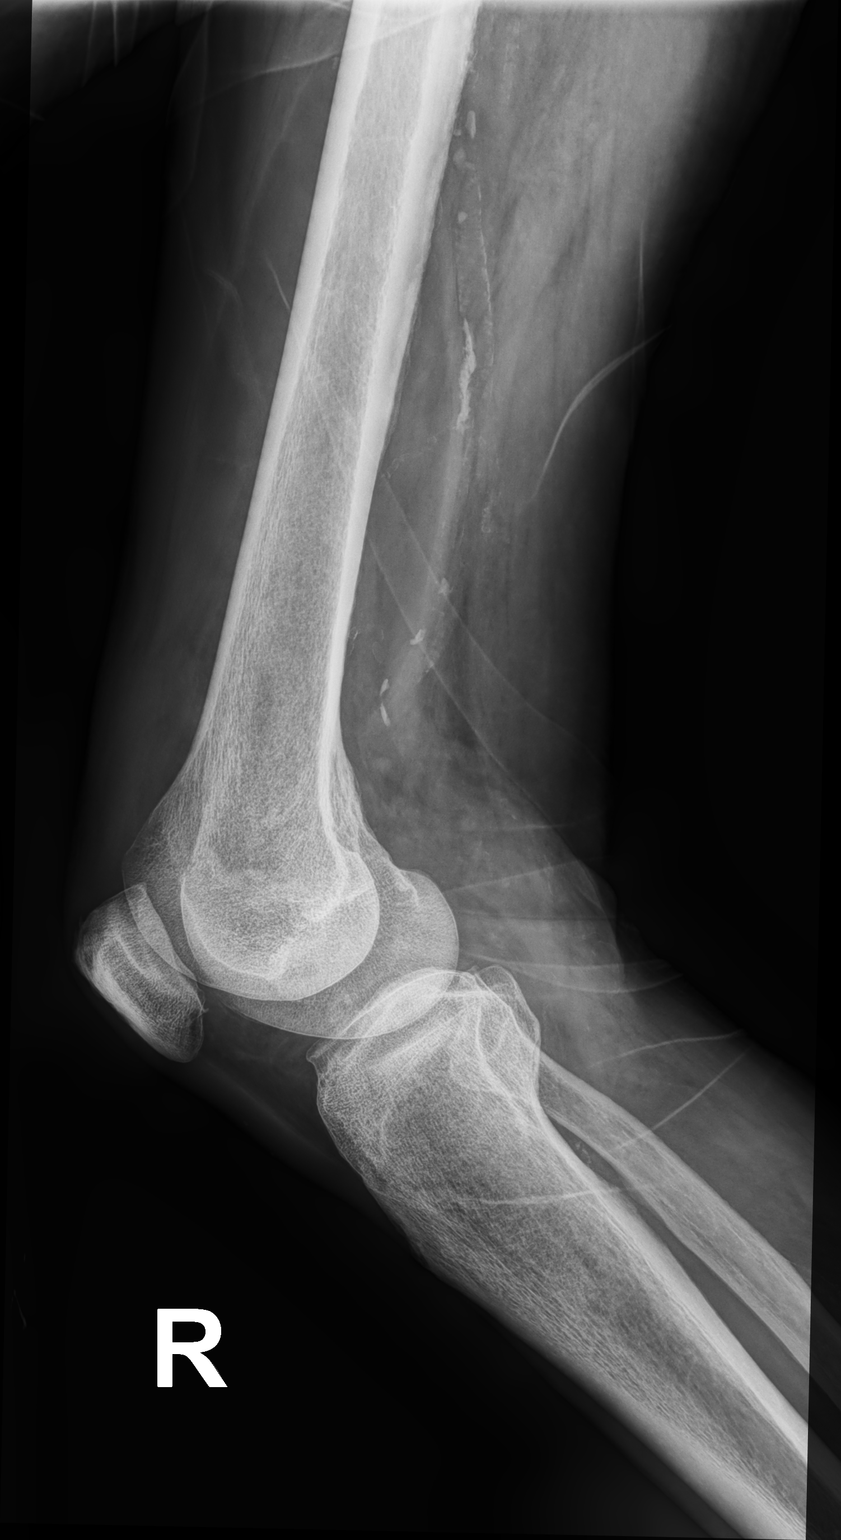

[4 of 4 positions shown; findings below may reference images not displayed]

FINDINGS: There is no acute bony or joint abnormality. Joint spaces are
preserved. Chondrocalcinosis is noted. No joint effusion.
Atherosclerotic vascular disease is seen.
IMPRESSION: No acute abnormality.  Negative for arthropathy.

Chondrocalcinosis.

Atherosclerosis.

## 2021-06-06 ENCOUNTER — Telehealth: Payer: Self-pay

## 2021-06-06 NOTE — Telephone Encounter (Signed)
Pt son called back and he dad is having trouble walking and son will check with his dad and callback to sch an appt ?

## 2021-06-06 NOTE — Telephone Encounter (Signed)
Last VV 01/05/20. ? ?LVM on son's contact requesting that he call back to update [place] on his father and/or schedule appt with PCP. ?

## 2021-07-25 ENCOUNTER — Ambulatory Visit: Payer: Medicare HMO | Admitting: Family Medicine

## 2021-10-19 DIAGNOSIS — N529 Male erectile dysfunction, unspecified: Secondary | ICD-10-CM | POA: Diagnosis not present

## 2021-10-19 DIAGNOSIS — R69 Illness, unspecified: Secondary | ICD-10-CM | POA: Diagnosis not present

## 2021-10-19 DIAGNOSIS — R030 Elevated blood-pressure reading, without diagnosis of hypertension: Secondary | ICD-10-CM | POA: Diagnosis not present

## 2022-03-21 DIAGNOSIS — L603 Nail dystrophy: Secondary | ICD-10-CM | POA: Diagnosis not present

## 2022-03-21 DIAGNOSIS — L84 Corns and callosities: Secondary | ICD-10-CM | POA: Diagnosis not present

## 2022-03-21 DIAGNOSIS — I739 Peripheral vascular disease, unspecified: Secondary | ICD-10-CM | POA: Diagnosis not present

## 2022-08-16 DIAGNOSIS — Z85828 Personal history of other malignant neoplasm of skin: Secondary | ICD-10-CM | POA: Diagnosis not present

## 2022-08-16 DIAGNOSIS — Z8249 Family history of ischemic heart disease and other diseases of the circulatory system: Secondary | ICD-10-CM | POA: Diagnosis not present

## 2022-08-16 DIAGNOSIS — I10 Essential (primary) hypertension: Secondary | ICD-10-CM | POA: Diagnosis not present

## 2022-08-16 DIAGNOSIS — N529 Male erectile dysfunction, unspecified: Secondary | ICD-10-CM | POA: Diagnosis not present

## 2022-08-16 DIAGNOSIS — R269 Unspecified abnormalities of gait and mobility: Secondary | ICD-10-CM | POA: Diagnosis not present

## 2022-08-16 DIAGNOSIS — Z91199 Patient's noncompliance with other medical treatment and regimen due to unspecified reason: Secondary | ICD-10-CM | POA: Diagnosis not present

## 2022-09-28 ENCOUNTER — Telehealth: Payer: Self-pay

## 2022-09-28 NOTE — Telephone Encounter (Signed)
LVM for patient to call back [phone], or to call PCP office to schedule follow up apt. AS, CMA  

## 2022-10-19 ENCOUNTER — Telehealth: Payer: Self-pay

## 2022-10-19 NOTE — Telephone Encounter (Signed)
LVM for patient to call back [phone], or to call PCP office to schedule follow up apt. AS, CMA
